# Patient Record
Sex: Female | Born: 1990 | Hispanic: No | Marital: Single | Smoking: Never smoker
Health system: Southern US, Community
[De-identification: ages and names within clinical notes are randomized; demographics above are authoritative.]

## PROBLEM LIST (undated history)

## (undated) ENCOUNTER — Inpatient Hospital Stay (HOSPITAL_COMMUNITY): Payer: Self-pay

## (undated) DIAGNOSIS — O350XX0 Maternal care for (suspected) central nervous system malformation in fetus, not applicable or unspecified: Secondary | ICD-10-CM

## (undated) DIAGNOSIS — S022XXA Fracture of nasal bones, initial encounter for closed fracture: Secondary | ICD-10-CM

## (undated) DIAGNOSIS — O139 Gestational [pregnancy-induced] hypertension without significant proteinuria, unspecified trimester: Secondary | ICD-10-CM

## (undated) DIAGNOSIS — Z3400 Encounter for supervision of normal first pregnancy, unspecified trimester: Secondary | ICD-10-CM

## (undated) DIAGNOSIS — I10 Essential (primary) hypertension: Secondary | ICD-10-CM

## (undated) HISTORY — PX: NOSE SURGERY: SHX723

---
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
## 2016-03-06 ENCOUNTER — Emergency Department (HOSPITAL_COMMUNITY)
Admission: EM | Admit: 2016-03-06 | Discharge: 2016-03-06 | Disposition: A | Discharge status: Home / Self Care | Payer: Self-pay | Attending: Emergency Medicine | Admitting: Emergency Medicine

## 2016-03-06 ENCOUNTER — Encounter (HOSPITAL_COMMUNITY): Payer: Self-pay | Admitting: Emergency Medicine

## 2016-03-06 DIAGNOSIS — R21 Rash and other nonspecific skin eruption: Secondary | ICD-10-CM | POA: Insufficient documentation

## 2016-03-06 HISTORY — DX: Fracture of nasal bones, initial encounter for closed fracture: S02.2XXA

## 2016-03-06 MED ORDER — PREDNISONE 20 MG PO TABS: 40.0000 mg | ORAL_TABLET | Freq: Every day | ORAL | 0 refills | Status: DC

## 2016-03-06 NOTE — ED Triage Notes (Signed)
Patient reports rash (first noticed l11/30/17) that originated on her right arm and has not spread to her shoulder, neck, and back regions. Reports that the rash burns. Patient has taken benadryl at home with minimal relief.

## 2016-03-06 NOTE — Discharge Instructions (Signed)
Continue taking Benadryl every 6 hours as needed for itching. Start taking prednisone daily over the next 4 days. Follow-up with dermatology. Return to the emergency department for worsening symptoms including shortness of breath, difficulty breathing, fever, or any new or concerning symptoms.

## 2016-03-06 NOTE — ED Provider Notes (Signed)
[facility] DEPT Provider Note   CSN: [id number] Arrival date & time: 03/06/16  1525  By signing my name below, I, Judithe Modest, attest that this documentation has been prepared under the direction and in the presence of Gloriann Loan, PA-C. Electronically Signed: Judithe Modest, ER Scribe. 11/12/2015. 4:00 PM.  History   Chief Complaint Chief Complaint  Patient presents with  . Rash   HPI  HPI Comments: Tiffany Ruiz is a 25 y.o. female who presents to the Emergency Department complaining of five days of itchy, burning rash all over her body. She has not changed her soap, lotion or other hygiene products. She denies eating unfamiliar foods. She denies changing medications, fever, or difficulty breathing. She has taken benadryl without relief.    Past Medical History:  Diagnosis Date  . Nose fracture     There are no active problems to display for this patient.   Past Surgical History:  Procedure Laterality Date  . NOSE SURGERY      OB History    No data available       Home Medications    Prior to Admission medications   Medication Sig Start Date End Date Taking? Authorizing Provider  predniSONE (DELTASONE) 20 MG tablet Take 2 tablets (40 mg total) by mouth daily. 03/06/16   Gloriann Loan, PA-C    Family History No family history on file.  Social History Social History  Substance Use Topics  . Smoking status: Never Smoker  . Smokeless tobacco: Never Used  . Alcohol use Yes     Allergies   Patient has no allergy information on record.   Review of Systems Review of Systems  Constitutional: Negative for chills and fever.  Respiratory: Negative for shortness of breath.   Cardiovascular: Negative for chest pain.  Skin: Positive for rash.  Neurological: Negative for weakness.  All other systems reviewed and are negative.  Physical Exam Updated Vital Signs BP 142/76 (BP Location: Right Arm)   Pulse 88   Temp 98.8 F (37.1 C) (Oral)   Resp 18    Ht 5\' 2"  (1.575 m)   Wt 68 kg   LMP 02/10/2016   SpO2 99%   BMI 27.44 kg/m   Physical Exam  Constitutional: She is oriented to person, place, and time. She appears well-developed and well-nourished. No distress.  HENT:  Head: Normocephalic and atraumatic.  Eyes: Pupils are equal, round, and reactive to light.  Neck: Neck supple.  Cardiovascular: Normal rate.   Pulmonary/Chest: Effort normal. No respiratory distress.  Musculoskeletal: Normal range of motion.  Neurological: She is alert and oriented to person, place, and time. Coordination normal.  Skin: Skin is warm and dry. Rash noted. She is not diaphoretic.  Generalized maculopapular rash over arms, torso, and legs. No purulent drainage or signs of infection.   Psychiatric: She has a normal mood and affect. Her behavior is normal.  Nursing note and vitals reviewed.    ED Treatments / Results  Labs (all labs ordered are listed, but only abnormal results are displayed) Labs Reviewed - No data to display  EKG  EKG Interpretation None       Radiology No results found.  Procedures Procedures (including critical care time)  Medications Ordered in ED Medications - No data to display   Initial Impression / Assessment and Plan / ED Course  I have reviewed the triage vital signs and the nursing notes.  Pertinent labs & imaging results that were available during my care of  the patient were reviewed by me and considered in my medical decision making (see chart for details).  Clinical Course     Patient with nonspecific eruption. No signs of infection. No signs of anaphylaxis.  Discharge with symptomatic treatment. Follow up with Dermatology.  Return precautions discussed.    Final Clinical Impressions(s) / ED Diagnoses   Final diagnoses:  Rash    New Prescriptions Discharge Medication List as of 03/06/2016  4:05 PM    START taking these medications   Details  predniSONE (DELTASONE) 20 MG tablet Take 2  tablets (40 mg total) by mouth daily., Starting Tue 03/06/2016, Print        I personally performed the services described in this documentation, which was scribed in my presence. The recorded information has been reviewed and is accurate.        Gloriann Loan, PA-C 03/06/16 1629    Davonna Belling, MD 03/06/16 [phone]

## 2017-04-02 NOTE — L&D Delivery Note (Addendum)
Patient: Tiffany Ruiz MRN: [id number]  GBS status: postive, IAP given penicillin given x2  Patient is a 27 y.o. now G1P1 s/p NSVD at [redacted]w[redacted]d, who was admitted for SOL. SROM 1h 55m prior to delivery with clear  fluid.    Delivery Note At 5:22 PM a viable and healthy female was delivered via Vaginal, Spontaneous (Presentation:vertex; LOA ).  APGAR: 9, 9; weight  pending.   Placenta status: delivered spontaneous, intact.  Cord: 3-vessel with the following complications: none.  Cord pH: pending  Anesthesia:  Fentanyl with plans for epidural but waited too long Episiotomy: None Lacerations: 2nd degree Suture Repair: 3.0 vicryl Est. Blood Loss (mL): 150  Mom to postpartum.  Baby to Couplet care / Skin to Skin.  Chelsey Geanie Berlin 03/13/2018, 6:29 PM  Head delivered LOA. No nuchal cord present. Shoulder and body delivered in usual fashion. Infant with spontaneous cry, placed on mother's abdomen, dried and bulb suctioned. Cord clamped x 2 after 1-minute delay, and cut by family member. Cord blood drawn. Placenta delivered spontaneously with gentle cord traction. Fundus firm with massage and Pitocin. Perineum inspected and found to have 2nd degree laceration,which was repaired with 3 sutures with good hemostasis achieved.  Midwife attestation: I was gloved and present for delivery in its entirety and I agree with the above resident's note.  Maryann Conners, CNM 6:52 PM

## 2017-09-05 ENCOUNTER — Other Ambulatory Visit: Payer: Self-pay

## 2017-09-05 ENCOUNTER — Emergency Department (HOSPITAL_COMMUNITY)
Admission: EM | Admit: 2017-09-05 | Discharge: 2017-09-06 | Disposition: A | Discharge status: Home / Self Care | Payer: Medicaid Other | Attending: Emergency Medicine | Admitting: Emergency Medicine

## 2017-09-05 ENCOUNTER — Encounter (HOSPITAL_COMMUNITY): Payer: Self-pay | Admitting: Emergency Medicine

## 2017-09-05 DIAGNOSIS — O2311 Infections of bladder in pregnancy, first trimester: Secondary | ICD-10-CM | POA: Diagnosis not present

## 2017-09-05 DIAGNOSIS — O2341 Unspecified infection of urinary tract in pregnancy, first trimester: Secondary | ICD-10-CM

## 2017-09-05 DIAGNOSIS — Z3A10 10 weeks gestation of pregnancy: Secondary | ICD-10-CM | POA: Diagnosis not present

## 2017-09-05 DIAGNOSIS — R1012 Left upper quadrant pain: Secondary | ICD-10-CM | POA: Diagnosis present

## 2017-09-05 NOTE — ED Triage Notes (Signed)
Pt from home with c/o left sided abdominal pain. Pt reports she is [redacted] weeks pregnant. Pt states she is also constipated and has a headache. Pt denies any abnormal discharge and denies bleeding.

## 2017-09-06 LAB — URINALYSIS, ROUTINE W REFLEX MICROSCOPIC
Bilirubin Urine: NEGATIVE
GLUCOSE, UA: NEGATIVE mg/dL
HGB URINE DIPSTICK: NEGATIVE
KETONES UR: 20 mg/dL — AB
NITRITE: NEGATIVE
PH: 6 (ref 5.0–8.0)
PROTEIN: NEGATIVE mg/dL
Specific Gravity, Urine: 1.005 (ref 1.005–1.030)

## 2017-09-06 LAB — PREGNANCY, URINE: Preg Test, Ur: POSITIVE — AB

## 2017-09-06 MED ORDER — CEPHALEXIN 500 MG PO CAPS
1000.0000 mg | ORAL_CAPSULE | Freq: Once | ORAL | Status: AC
  Administered 2017-09-06: 1000 mg via ORAL
  Filled 2017-09-06: qty 2

## 2017-09-06 MED ORDER — ACETAMINOPHEN 500 MG PO TABS
1000.0000 mg | ORAL_TABLET | Freq: Once | ORAL | Status: AC
  Administered 2017-09-06: 1000 mg via ORAL
  Filled 2017-09-06: qty 2

## 2017-09-06 MED ORDER — SUCRALFATE 1 G PO TABS: 1.0000 g | ORAL_TABLET | Freq: Four times a day (QID) | ORAL | 0 refills | Status: DC | PRN

## 2017-09-06 MED ORDER — CEPHALEXIN 500 MG PO CAPS: 500.0000 mg | ORAL_CAPSULE | Freq: Two times a day (BID) | ORAL | 0 refills | Status: DC

## 2017-09-06 MED ORDER — SUCRALFATE 1 G PO TABS
1.0000 g | ORAL_TABLET | Freq: Once | ORAL | Status: AC
  Administered 2017-09-06: 1 g via ORAL
  Filled 2017-09-06: qty 1

## 2017-09-06 NOTE — ED Notes (Addendum)
Pt aware that urine sample is needed.  

## 2017-09-06 NOTE — ED Provider Notes (Signed)
[facility] DEPT Provider Note: Georgena Spurling, MD, FACEP  CSN: [id number] MRN: [id number] ARRIVAL: 09/05/17 at 2124 ROOM: [place]  Abdominal Pain   HISTORY OF PRESENT ILLNESS  09/06/17 12:26 AM Tiffany Ruiz is a 27 y.o. female who reports being [redacted] weeks pregnant.  She has had a headache since yesterday.  The headache is not severe.  She took one Tylenol yesterday without relief.  She has also had some nausea but relates this to usual pregnancy related nausea.  She is here now with a 1 day history of left upper quadrant abdominal pain.  The pain is dull and moderate in severity, worse with palpation. There is associated constipation.  She denies vaginal discharge or bleeding.   Past Medical History:  Diagnosis Date  . Nose fracture     Past Surgical History:  Procedure Laterality Date  . NOSE SURGERY      No family history on file.  Social History   Tobacco Use  . Smoking status: Never Smoker  . Smokeless tobacco: Never Used  Substance Use Topics  . Alcohol use: Yes  . Drug use: Not on file    Prior to Admission medications   Medication Sig Start Date End Date Taking? Authorizing Provider  predniSONE (DELTASONE) 20 MG tablet Take 2 tablets (40 mg total) by mouth daily. Patient not taking: Reported on 09/06/2017 03/06/16   Gloriann Loan, PA-C    Allergies Patient has no known allergies.   REVIEW OF SYSTEMS  Negative except as noted here or in the History of Present Illness.   PHYSICAL EXAMINATION  Initial Vital Signs Blood pressure 120/75, pulse 72, temperature 98.2 F (36.8 C), temperature source Oral, resp. rate 16, SpO2 100 %.  Examination General: Well-developed, well-nourished female in no acute distress; appearance consistent with age of record HENT: normocephalic; atraumatic Eyes: pupils equal, round and reactive to light; extraocular muscles intact Neck: supple Heart: regular rate and rhythm Lungs: clear to auscultation  bilaterally Abdomen: soft; nondistended; left upper quadrant tenderness; no masses or hepatosplenomegaly; bowel sounds present Extremities: No deformity; full range of motion; pulses normal Neurologic: Awake, alert and oriented; motor function intact in all extremities and symmetric; no facial droop Skin: Warm and dry Psychiatric: Normal mood and affect   RESULTS  Summary of this visit's results, reviewed by myself:   EKG Interpretation  Date/Time:    Ventricular Rate:    PR Interval:    QRS Duration:   QT Interval:    QTC Calculation:   R Axis:     Text Interpretation:        Laboratory Studies: Results for orders placed or performed during the hospital encounter of 09/05/17 (from the past 24 hour(s))  Urinalysis, Routine w reflex microscopic     Status: Abnormal   Collection Time: 09/06/17  1:42 AM  Result Value Ref Range   Color, Urine STRAW (A) YELLOW   APPearance HAZY (A) CLEAR   Specific Gravity, Urine 1.005 1.005 - 1.030   pH 6.0 5.0 - 8.0   Glucose, UA NEGATIVE NEGATIVE mg/dL   Hgb urine dipstick NEGATIVE NEGATIVE   Bilirubin Urine NEGATIVE NEGATIVE   Ketones, ur 20 (A) NEGATIVE mg/dL   Protein, ur NEGATIVE NEGATIVE mg/dL   Nitrite NEGATIVE NEGATIVE   Leukocytes, UA LARGE (A) NEGATIVE   RBC / HPF 6-10 0 - 5 RBC/hpf   WBC, UA 11-20 0 - 5 WBC/hpf   Bacteria, UA RARE (A) NONE SEEN   Squamous Epithelial /  LPF 11-20 0 - 5   Mucus PRESENT   Pregnancy, urine     Status: Abnormal   Collection Time: 09/06/17  1:42 AM  Result Value Ref Range   Preg Test, Ur POSITIVE (A) NEGATIVE   Imaging Studies: No results found.  ED COURSE and MDM  Nursing notes and initial vitals signs, including pulse oximetry, reviewed.  Vitals:   09/05/17 2204 09/06/17 0034 09/06/17 0100 09/06/17 0130  BP: 120/75 129/82 111/75 118/74  Pulse: 72 87 94 89  Resp: 16 17 20  (!) 21  Temp: 98.2 F (36.8 C)     TempSrc: Oral     SpO2: 100% 100% 100% 99%   2:42 AM Patient feels better  after Carafate.  I suspect her pain is due to gastritis.  Her urinalysis is consistent with a urinary tract infection and given that she is pregnant we will treat.  PROCEDURES    ED DIAGNOSES     ICD-10-CM   1. Left upper quadrant pain R10.12   2. Urinary tract infection in mother during first trimester of pregnancy O23.41        Mikaili Flippin, MD 09/06/17 [phone]

## 2017-09-07 ENCOUNTER — Telehealth (HOSPITAL_BASED_OUTPATIENT_CLINIC_OR_DEPARTMENT_OTHER): Payer: Self-pay | Admitting: Emergency Medicine

## 2017-09-07 LAB — URINE CULTURE

## 2017-09-08 ENCOUNTER — Telehealth: Payer: Self-pay

## 2017-09-08 NOTE — Telephone Encounter (Signed)
Post ED Visit - Positive Culture Follow-up  Culture report reviewed by antimicrobial stewardship pharmacist:  []  Elenor Quinones, Pharm.D. []  Heide Guile, Pharm.D., BCPS AQ-ID []  Parks Neptune, Pharm.D., BCPS []  Alycia Rossetti, Pharm.D., BCPS []  [place]lea, Pharm.D., BCPS, AAHIVP []  Legrand Como, Pharm.D., BCPS, AAHIVP []  Salome Arnt, PharmD, BCPS []  Wynell Balloon, PharmD []  Vincenza Hews, PharmD, BCPS [facility] Pharm D  Positive urine culture  and no further patient follow-up is required at this time.  Genia Del 09/08/2017, 10:23 AM

## 2017-09-19 ENCOUNTER — Ambulatory Visit (INDEPENDENT_AMBULATORY_CARE_PROVIDER_SITE_OTHER): Payer: Medicaid Other | Admitting: Certified Nurse Midwife

## 2017-09-19 ENCOUNTER — Other Ambulatory Visit (HOSPITAL_COMMUNITY)
Admission: RE | Admit: 2017-09-19 | Discharge: 2017-09-19 | Disposition: A | Discharge status: Home / Self Care | Payer: Medicaid Other | Source: Ambulatory Visit | Attending: Certified Nurse Midwife | Admitting: Certified Nurse Midwife

## 2017-09-19 ENCOUNTER — Encounter: Payer: Self-pay | Admitting: Certified Nurse Midwife

## 2017-09-19 VITALS — BP 127/81 | HR 96 | Wt 161.0 lb

## 2017-09-19 DIAGNOSIS — Z3400 Encounter for supervision of normal first pregnancy, unspecified trimester: Secondary | ICD-10-CM | POA: Insufficient documentation

## 2017-09-19 DIAGNOSIS — Z3401 Encounter for supervision of normal first pregnancy, first trimester: Secondary | ICD-10-CM

## 2017-09-19 DIAGNOSIS — Z3A00 Weeks of gestation of pregnancy not specified: Secondary | ICD-10-CM | POA: Insufficient documentation

## 2017-09-19 HISTORY — DX: Encounter for supervision of normal first pregnancy, unspecified trimester: Z34.00

## 2017-09-19 MED ORDER — OB COMPLETE PETITE 35-5-1-200 MG PO CAPS
1.0000 | ORAL_CAPSULE | Freq: Every day | ORAL | 12 refills | Status: DC
Start: 2017-09-19 — End: 2018-02-24

## 2017-09-19 NOTE — Patient Instructions (Signed)
First Trimester of Pregnancy The first trimester of pregnancy is from week 1 until the end of week 13 (months 1 through 3). A week after a sperm fertilizes an egg, the egg will implant on the wall of the uterus. This embryo will begin to develop into a baby. Genes from you and your partner will form the baby. The female genes will determine whether the baby will be a boy or a girl. At 6-8 weeks, the eyes and face will be formed, and the heartbeat can be seen on ultrasound. At the end of 12 weeks, all the baby's organs will be formed. Now that you are pregnant, you will want to do everything you can to have a healthy baby. Two of the most important things are to get good prenatal care and to follow your health care provider's instructions. Prenatal care is all the medical care you receive before the baby's birth. This care will help prevent, find, and treat any problems during the pregnancy and childbirth. Body changes during your first trimester Your body goes through many changes during pregnancy. The changes vary from woman to woman.  You may gain or lose a couple of pounds at first.  You may feel sick to your stomach (nauseous) and you may throw up (vomit). If the vomiting is uncontrollable, call your health care provider.  You may tire easily.  You may develop headaches that can be relieved by medicines. All medicines should be approved by your health care provider.  You may urinate more often. Painful urination may mean you have a bladder infection.  You may develop heartburn as a result of your pregnancy.  You may develop constipation because certain hormones are causing the muscles that push stool through your intestines to slow down.  You may develop hemorrhoids or swollen veins (varicose veins).  Your breasts may begin to grow larger and become tender. Your nipples may stick out more, and the tissue that surrounds them (areola) may become darker.  Your gums may bleed and may be  sensitive to brushing and flossing.  Dark spots or blotches (chloasma, mask of pregnancy) may develop on your face. This will likely fade after the baby is born.  Your menstrual periods will stop.  You may have a loss of appetite.  You may develop cravings for certain kinds of food.  You may have changes in your emotions from day to day, such as being excited to be pregnant or being concerned that something may go wrong with the pregnancy and baby.  You may have more vivid and strange dreams.  You may have changes in your hair. These can include thickening of your hair, rapid growth, and changes in texture. Some women also have hair loss during or after pregnancy, or hair that feels dry or thin. Your hair will most likely return to normal after your baby is born.  What to expect at prenatal visits During a routine prenatal visit:  You will be weighed to make sure you and the baby are growing normally.  Your blood pressure will be taken.  Your abdomen will be measured to track your baby's growth.  The fetal heartbeat will be listened to between weeks 10 and 14 of your pregnancy.  Test results from any previous visits will be discussed.  Your health care provider may ask you:  How you are feeling.  If you are feeling the baby move.  If you have had any abnormal symptoms, such as leaking fluid, bleeding, severe headaches,   or abdominal cramping.  If you are using any tobacco products, including cigarettes, chewing tobacco, and electronic cigarettes.  If you have any questions.  Other tests that may be performed during your first trimester include:  Blood tests to find your blood type and to check for the presence of any previous infections. The tests will also be used to check for low iron levels (anemia) and protein on red blood cells (Rh antibodies). Depending on your risk factors, or if you previously had diabetes during pregnancy, you may have tests to check for high blood  sugar that affects pregnant women (gestational diabetes).  Urine tests to check for infections, diabetes, or protein in the urine.  An ultrasound to confirm the proper growth and development of the baby.  Fetal screens for spinal cord problems (spina bifida) and Down syndrome.  HIV (human immunodeficiency virus) testing. Routine prenatal testing includes screening for HIV, unless you choose not to have this test.  You may need other tests to make sure you and the baby are doing well.  Follow these instructions at home: Medicines  Follow your health care provider's instructions regarding medicine use. Specific medicines may be either safe or unsafe to take during pregnancy.  Take a prenatal vitamin that contains at least 600 micrograms (mcg) of folic acid.  If you develop constipation, try taking a stool softener if your health care provider approves. Eating and drinking  Eat a balanced diet that includes fresh fruits and vegetables, whole grains, good sources of protein such as meat, eggs, or tofu, and low-fat dairy. Your health care provider will help you determine the amount of weight gain that is right for you.  Avoid raw meat and uncooked cheese. These carry germs that can cause birth defects in the baby.  Eating four or five small meals rather than three large meals a day may help relieve nausea and vomiting. If you start to feel nauseous, eating a few soda crackers can be helpful. Drinking liquids between meals, instead of during meals, also seems to help ease nausea and vomiting.  Limit foods that are high in fat and processed sugars, such as fried and sweet foods.  To prevent constipation: ? Eat foods that are high in fiber, such as fresh fruits and vegetables, whole grains, and beans. ? Drink enough fluid to keep your urine clear or pale yellow. Activity  Exercise only as directed by your health care provider. Most women can continue their usual exercise routine during  pregnancy. Try to exercise for 30 minutes at least 5 days a week. Exercising will help you: ? Control your weight. ? Stay in shape. ? Be prepared for labor and delivery.  Experiencing pain or cramping in the lower abdomen or lower back is a good sign that you should stop exercising. Check with your health care provider before continuing with normal exercises.  Try to avoid standing for long periods of time. Move your legs often if you must stand in one place for a long time.  Avoid heavy lifting.  Wear low-heeled shoes and practice good posture.  You may continue to have sex unless your health care provider tells you not to. Relieving pain and discomfort  Wear a good support bra to relieve breast tenderness.  Take warm sitz baths to soothe any pain or discomfort caused by hemorrhoids. Use hemorrhoid cream if your health care provider approves.  Rest with your legs elevated if you have leg cramps or low back pain.  If you develop   varicose veins in your legs, wear support hose. Elevate your feet for 15 minutes, 3-4 times a day. Limit salt in your diet. Prenatal care  Schedule your prenatal visits by the twelfth week of pregnancy. They are usually scheduled monthly at first, then more often in the last 2 months before delivery.  Write down your questions. Take them to your prenatal visits.  Keep all your prenatal visits as told by your health care provider. This is important. Safety  Wear your seat belt at all times when driving.  Make a list of emergency phone numbers, including numbers for family, friends, the hospital, and police and fire departments. General instructions  Ask your health care provider for a referral to a local prenatal education class. Begin classes no later than the beginning of month 6 of your pregnancy.  Ask for help if you have counseling or nutritional needs during pregnancy. Your health care provider can offer advice or refer you to specialists for help  with various needs.  Do not use hot tubs, steam rooms, or saunas.  Do not douche or use tampons or scented sanitary pads.  Do not cross your legs for long periods of time.  Avoid cat litter boxes and soil used by cats. These carry germs that can cause birth defects in the baby and possibly loss of the fetus by miscarriage or stillbirth.  Avoid all smoking, herbs, alcohol, and medicines not prescribed by your health care provider. Chemicals in these products affect the formation and growth of the baby.  Do not use any products that contain nicotine or tobacco, such as cigarettes and e-cigarettes. If you need help quitting, ask your health care provider. You may receive counseling support and other resources to help you quit.  Schedule a dentist appointment. At home, brush your teeth with a soft toothbrush and be gentle when you floss. Contact a health care provider if:  You have dizziness.  You have mild pelvic cramps, pelvic pressure, or nagging pain in the abdominal area.  You have persistent nausea, vomiting, or diarrhea.  You have a bad smelling vaginal discharge.  You have pain when you urinate.  You notice increased swelling in your face, hands, legs, or ankles.  You are exposed to fifth disease or chickenpox.  You are exposed to German measles (rubella) and have never had it. Get help right away if:  You have a fever.  You are leaking fluid from your vagina.  You have spotting or bleeding from your vagina.  You have severe abdominal cramping or pain.  You have rapid weight gain or loss.  You vomit blood or material that looks like coffee grounds.  You develop a severe headache.  You have shortness of breath.  You have any kind of trauma, such as from a fall or a car accident. Summary  The first trimester of pregnancy is from week 1 until the end of week 13 (months 1 through 3).  Your body goes through many changes during pregnancy. The changes vary from  woman to woman.  You will have routine prenatal visits. During those visits, your health care provider will examine you, discuss any test results you may have, and talk with you about how you are feeling. This information is not intended to replace advice given to you by your health care provider. Make sure you discuss any questions you have with your health care provider. Document Released: 03/13/2001 Document Revised: 02/29/2016 Document Reviewed: 02/29/2016 Elsevier Interactive Patient Education  2018 Else[employer]   Inc[employer]  Constipation, Adult Constipation is when a person has fewer bowel movements in a week than normal, has difficulty having a bowel movement, or has stools that are dry, hard, or larger than normal. Constipation may be caused by an underlying condition. It may become worse with age if a person takes certain medicines and does not take in enough fluids. Follow these instructions at home: Eating and drinking   Eat foods that have a lot of fiber, such as fresh fruits and vegetables, whole grains, and beans.  Limit foods that are high in fat, low in fiber, or overly processed, such as french fries, hamburgers, cookies, candies, and soda.  Drink enough fluid to keep your urine clear or pale yellow. General instructions  Exercise regularly or as told by your health care provider.  Go to the restroom when you have the urge to go. Do not hold it in.  Take over-the-counter and prescription medicines only as told by your health care provider. These include any fiber supplements.  Practice pelvic floor retraining exercises, such as deep breathing while relaxing the lower abdomen and pelvic floor relaxation during bowel movements.  Watch your condition for any changes.  Keep all follow-up visits as told by your health care provider. This is important. Contact a health care provider if:  You have pain that gets worse.  You have a fever.  You do not have a bowel movement after 4  days.  You vomit.  You are not hungry.  You lose weight.  You are bleeding from the anus.  You have thin, pencil-like stools. Get help right away if:  You have a fever and your symptoms suddenly get worse.  You leak stool or have blood in your stool.  Your abdomen is bloated.  You have severe pain in your abdomen.  You feel dizzy or you faint. This information is not intended to replace advice given to you by your health care provider. Make sure you discuss any questions you have with your health care provider. Document Released: 12/16/2003 Document Revised: 10/07/2015 Document Reviewed: 09/07/2015 Elsevier Interactive Patient Education  2018 [employer].  Prenatal Care WHAT IS PRENATAL CARE? Prenatal care is the process of caring for a pregnant woman before she gives birth. Prenatal care makes sure that she and her baby remain as healthy as possible throughout pregnancy. Prenatal care may be provided by a midwife, family practice health care provider, or a childbirth and pregnancy specialist (obstetrician). Prenatal care may include physical examinations, testing, treatments, and education on nutrition, lifestyle, and social support services. WHY IS PRENATAL CARE SO IMPORTANT? Early and consistent prenatal care increases the chance that you and your baby will remain healthy throughout your pregnancy. This type of care also decreases a baby's risk of being born too early (prematurely), or being born smaller than expected (small for gestational age). Any underlying medical conditions you may have that could pose a risk during your pregnancy are discussed during prenatal care visits. You will also be monitored regularly for any new conditions that may arise during your pregnancy so they can be treated quickly and effectively. WHAT HAPPENS DURING PRENATAL CARE VISITS? Prenatal care visits may include the following: Discussion Tell your health care provider about any new signs or  symptoms you have experienced since your last visit. These might include:  Nausea or vomiting.  Increased or decreased level of energy.  Difficulty sleeping.  Back or leg pain.  Weight changes.  Frequent urination.  Shortness of breath  with physical activity.  Changes in your skin, such as the development of a rash or itchiness.  Vaginal discharge or bleeding.  Feelings of excitement or nervousness.  Changes in your baby's movements.  You may want to write down any questions or topics you want to discuss with your health care provider and bring them with you to your appointment. Examination During your first prenatal care visit, you will likely have a complete physical exam. Your health care provider will often examine your vagina, cervix, and the position of your uterus, as well as check your heart, lungs, and other body systems. As your pregnancy progresses, your health care provider will measure the size of your uterus and your baby's position inside your uterus. He or she may also examine you for early signs of labor. Your prenatal visits may also include checking your blood pressure and, after about 10-12 weeks of pregnancy, listening to your baby's heartbeat. Testing Regular testing often includes:  Urinalysis. This checks your urine for glucose, protein, or signs of infection.  Blood count. This checks the levels of white and red blood cells in your body.  Tests for sexually transmitted infections (STIs). Testing for STIs at the beginning of pregnancy is routinely done and is required in many states.  Antibody testing. You will be checked to see if you are immune to certain illnesses, such as rubella, that can affect a developing fetus.  Glucose screen. Around 24-28 weeks of pregnancy, your blood glucose level will be checked for signs of gestational diabetes. Follow-up tests may be recommended.  Group B strep. This is a bacteria that is commonly found inside a woman's  vagina. This test will inform your health care provider if you need an antibiotic to reduce the amount of this bacteria in your body prior to labor and childbirth.  Ultrasound. Many pregnant women undergo an ultrasound screening around 18-20 weeks of pregnancy to evaluate the health of the fetus and check for any developmental abnormalities.  HIV (human immunodeficiency virus) testing. Early in your pregnancy, you will be screened for HIV. If you are at high risk for HIV, this test may be repeated during your third trimester of pregnancy.  You may be offered other testing based on your age, personal or family medical history, or other factors. HOW OFTEN SHOULD I PLAN TO SEE MY HEALTH CARE PROVIDER FOR PRENATAL CARE? Your prenatal care check-up schedule depends on any medical conditions you have before, or develop during, your pregnancy. If you do not have any underlying medical conditions, you will likely be seen for checkups:  Monthly, during the first 6 months of pregnancy.  Twice a month during months 7 and 8 of pregnancy.  Weekly starting in the 9th month of pregnancy and until delivery.  If you develop signs of early labor or other concerning signs or symptoms, you may need to see your health care provider more often. Ask your health care provider what prenatal care schedule is best for you. WHAT CAN I DO TO KEEP MYSELF AND MY BABY AS HEALTHY AS POSSIBLE DURING MY PREGNANCY?  Take a prenatal vitamin containing 400 micrograms (0.4 mg) of folic acid every day. Your health care provider may also ask you to take additional vitamins such as iodine, vitamin D, iron, copper, and zinc.  Take 1500-2000 mg of calcium daily starting at your 20th week of pregnancy until you deliver your baby.  Make sure you are up to date on your vaccinations. Unless directed otherwise by your  health care provider: ? You should receive a tetanus, diphtheria, and pertussis (Tdap) vaccination between the 27th and 36th  week of your pregnancy, regardless of when your last Tdap immunization occurred. This helps protect your baby from whooping cough (pertussis) after he or she is born. ? You should receive an annual inactivated influenza vaccine (IIV) to help protect you and your baby from influenza. This can be done at any point during your pregnancy.  Eat a well-rounded diet that includes: ? Fresh fruits and vegetables. ? Lean proteins. ? Calcium-rich foods such as milk, yogurt, hard cheeses, and dark, leafy greens. ? Whole grain breads.  Do noteat seafood high in mercury, including: ? Swordfish. ? Tilefish. ? Shark. ? King mackerel. ? More than 6 oz tuna per week.  Do not eat: ? Raw or undercooked meats or eggs. ? Unpasteurized foods, such as soft cheeses (brie, blue, or feta), juices, and milks. ? Lunch meats. ? Hot dogs that have not been heated until they are steaming.  Drink enough water to keep your urine clear or pale yellow. For many women, this may be 10 or more 8 oz glasses of water each day. Keeping yourself hydrated helps deliver nutrients to your baby and may prevent the start of pre-term uterine contractions.  Do not use any tobacco products including cigarettes, chewing tobacco, or electronic cigarettes. If you need help quitting, ask your health care provider.  Do not drink beverages containing alcohol. No safe level of alcohol consumption during pregnancy has been determined.  Do not use any illegal drugs. These can harm your developing baby or cause a miscarriage.  Ask your health care provider or pharmacist before taking any prescription or over-the-counter medicines, herbs, or supplements.  Limit your caffeine intake to no more than 200 mg per day.  Exercise. Unless told otherwise by your health care provider, try to get 30 minutes of moderate exercise most days of the week. Do not  do high-impact activities, contact sports, or activities with a high risk of falling, such as  horseback riding or downhill skiing.  Get plenty of rest.  Avoid anything that raises your body temperature, such as hot tubs and saunas.  If you own a cat, do not empty its litter box. Bacteria contained in cat feces can cause an infection called toxoplasmosis. This can result in serious harm to the fetus.  Stay away from chemicals such as insecticides, lead, mercury, and cleaning or paint products that contain solvents.  Do not have any X-rays taken unless medically necessary.  Take a childbirth and breastfeeding preparation class. Ask your health care provider if you need a referral or recommendation.  This information is not intended to replace advice given to you by your health care provider. Make sure you discuss any questions you have with your health care provider. Document Released: 03/22/2003 Document Revised: 08/22/2015 Document Reviewed: 06/03/2013 Elsevier Interactive Patient Education  2017 [employer].

## 2017-09-19 NOTE — Progress Notes (Signed)
Subjective:   Tiffany Ruiz is a 27 y.o. G1P0 at [redacted]w[redacted]d by LMP being seen today for her first obstetrical visit.  Her obstetrical history is significant for none. Patient does intend to breast feed. Pregnancy history fully reviewed.  Patient reports no bleeding, no contractions, no cramping, no leaking and constipation: OTC Colace discussed.  HISTORY: OB History  Gravida Para Term Preterm AB Living  1 0 0 0 0 0  SAB TAB Ectopic Multiple Live Births  0 0 0 0 0    # Outcome Date GA Lbr Len/2nd Weight Sex Delivery Anes PTL Lv  1 Current             [facility]. Normal according to the patient  Past Medical History:  Diagnosis Date  . Nose fracture    Past Surgical History:  Procedure Laterality Date  . MOUTH SURGERY  2011   wisdom tooth   . NOSE SURGERY     Family History  Problem Relation Age of Onset  . Stroke Maternal Grandfather    Social History   Tobacco Use  . Smoking status: Never Smoker  . Smokeless tobacco: Never Used  Substance Use Topics  . Alcohol use: Not Currently  . Drug use: Never   No Known Allergies Current Outpatient Medications on File Prior to Visit  Medication Sig Dispense Refill  . sucralfate (CARAFATE) 1 g tablet Take 1 tablet (1 g total) by mouth 4 (four) times daily as needed (stomach pain). (Patient not taking: Reported on 09/19/2017) 30 tablet 0   No current facility-administered medications on file prior to visit.     Review of Systems Pertinent items noted in HPI and remainder of comprehensive ROS otherwise negative.  Exam   Vitals:   09/19/17 1443  BP: 127/81  Pulse: 96  Weight: 161 lb (73 kg)   Fetal Heart Rate (bpm): 150; doppler  Uterus:     Pelvic Exam: Perineum: no hemorrhoids, normal perineum   Vulva: normal external genitalia, no lesions   Vagina:  normal mucosa, normal discharge   Cervix: no lesions and normal, pap smear done.    Adnexa: normal adnexa and  no mass, fullness, tenderness   Bony Pelvis: average  System: General: well-developed, well-nourished female in no acute distress   Breast:  normal appearance, no masses or tenderness   Skin: normal coloration and turgor, no rashes   Neurologic: oriented, normal, negative, normal mood   Extremities: normal strength, tone, and muscle mass, ROM of all joints is normal   HEENT PERRLA, extraocular movement intact and sclera clear, anicteric   Mouth/Teeth mucous membranes moist, pharynx normal without lesions and dental hygiene good   Neck supple and no masses   Cardiovascular: regular rate and rhythm   Respiratory:  no respiratory distress, normal breath sounds   Abdomen: soft, non-tender; bowel sounds normal; no masses,  no organomegaly     Assessment:   Pregnancy: G1P0 Patient Active Problem List   Diagnosis Date Noted  . Supervision of normal first pregnancy, antepartum 09/19/2017     Plan:  1. Supervision of normal first pregnancy, antepartum      - Hemoglobinopathy evaluation - Hemoglobin A1c - Obstetric Panel, Including HIV - Inheritest Core(CF97,SMA,FraX) - Genetic Screening - Enroll Patient in Babyscripts - Culture, OB Urine - Vitamin D (25 hydroxy) - Cytology - PAP - Cervicovaginal ancillary only - Prenat-FeCbn-FeAspGl-FA-Omega (OB COMPLETE PETITE) 35-5-1-200 MG CAPS; Take 1 tablet by  mouth daily.  Dispense: 30 capsule; Refill: 12   Initial labs drawn. Continue prenatal vitamins. Genetic Screening discussed, NIPS: ordered. Ultrasound discussed; fetal anatomic survey: ordered. Problem list reviewed and updated. The nature of [facility] with multiple MDs and other Advanced Practice Providers was explained to patient; also emphasized that residents, students are part of our team. Routine obstetric precautions reviewed. Return in about 1 month (around 10/17/2017) for ROB, [facility].     Kandis Cocking, [facility], [place]

## 2017-09-20 LAB — CERVICOVAGINAL ANCILLARY ONLY
Chlamydia: NEGATIVE
NEISSERIA GONORRHEA: NEGATIVE

## 2017-09-23 LAB — OBSTETRIC PANEL, INCLUDING HIV
Antibody Screen: NEGATIVE
BASOS ABS: 0 10*3/uL (ref 0.0–0.2)
Basos: 0 %
EOS (ABSOLUTE): 0 10*3/uL (ref 0.0–0.4)
Eos: 1 %
HEP B S AG: NEGATIVE
HIV Screen 4th Generation wRfx: NONREACTIVE
Hematocrit: 35.3 % (ref 34.0–46.6)
Hemoglobin: 11.8 g/dL (ref 11.1–15.9)
IMMATURE GRANULOCYTES: 0 %
Immature Grans (Abs): 0 10*3/uL (ref 0.0–0.1)
LYMPHS ABS: 1.3 10*3/uL (ref 0.7–3.1)
Lymphs: 17 %
MCH: 27.3 pg (ref 26.6–33.0)
MCHC: 33.4 g/dL (ref 31.5–35.7)
MCV: 82 fL (ref 79–97)
Monocytes Absolute: 0.5 10*3/uL (ref 0.1–0.9)
Monocytes: 7 %
NEUTROS PCT: 75 %
Neutrophils Absolute: 5.5 10*3/uL (ref 1.4–7.0)
PLATELETS: 207 10*3/uL (ref 150–450)
RBC: 4.32 x10E6/uL (ref 3.77–5.28)
RDW: 14.8 % (ref 12.3–15.4)
RPR: NONREACTIVE
Rh Factor: POSITIVE
Rubella Antibodies, IGG: 1.17 index (ref >0.99)
WBC: 7.4 10*3/uL (ref 3.4–10.8)

## 2017-09-23 LAB — URINE CULTURE, OB REFLEX

## 2017-09-23 LAB — HEMOGLOBIN A1C
ESTIMATED AVERAGE GLUCOSE: 108 mg/dL
HEMOGLOBIN A1C: 5.4 % (ref 4.8–5.6)

## 2017-09-23 LAB — HEMOGLOBINOPATHY EVALUATION
HGB C: 0 %
HGB S: 0 %
HGB VARIANT: 0 %
Hemoglobin A2 Quantitation: 2.2 % (ref 1.8–3.2)
Hemoglobin F Quantitation: 0 % (ref 0.0–2.0)
Hgb A: 97.8 % (ref 96.4–98.8)

## 2017-09-23 LAB — CULTURE, OB URINE

## 2017-09-23 LAB — VITAMIN D 25 HYDROXY (VIT D DEFICIENCY, FRACTURES): VIT D 25 HYDROXY: 38.2 ng/mL (ref 30.0–100.0)

## 2017-09-24 ENCOUNTER — Other Ambulatory Visit: Payer: Self-pay | Admitting: Certified Nurse Midwife

## 2017-09-24 DIAGNOSIS — B951 Streptococcus, group B, as the cause of diseases classified elsewhere: Secondary | ICD-10-CM

## 2017-09-24 DIAGNOSIS — Z3400 Encounter for supervision of normal first pregnancy, unspecified trimester: Secondary | ICD-10-CM

## 2017-09-24 MED ORDER — NITROFURANTOIN MONOHYD MACRO 100 MG PO CAPS: 100.0000 mg | ORAL_CAPSULE | Freq: Two times a day (BID) | ORAL | 0 refills | Status: DC

## 2017-09-25 ENCOUNTER — Other Ambulatory Visit: Payer: Self-pay | Admitting: Certified Nurse Midwife

## 2017-09-25 ENCOUNTER — Encounter: Payer: Self-pay | Admitting: Certified Nurse Midwife

## 2017-09-25 DIAGNOSIS — Z3400 Encounter for supervision of normal first pregnancy, unspecified trimester: Secondary | ICD-10-CM

## 2017-09-25 LAB — CYTOLOGY - PAP: DIAGNOSIS: NEGATIVE

## 2017-09-30 LAB — INHERITEST CORE(CF97,SMA,FRAX)

## 2017-10-08 ENCOUNTER — Other Ambulatory Visit: Payer: Self-pay | Admitting: Certified Nurse Midwife

## 2017-10-08 DIAGNOSIS — Z3400 Encounter for supervision of normal first pregnancy, unspecified trimester: Secondary | ICD-10-CM

## 2017-10-17 ENCOUNTER — Ambulatory Visit (INDEPENDENT_AMBULATORY_CARE_PROVIDER_SITE_OTHER): Payer: Medicaid Other | Admitting: Obstetrics and Gynecology

## 2017-10-17 ENCOUNTER — Encounter: Payer: Self-pay | Admitting: Obstetrics and Gynecology

## 2017-10-17 VITALS — BP 111/75 | HR 77 | Wt 166.0 lb

## 2017-10-17 DIAGNOSIS — R51 Headache: Secondary | ICD-10-CM

## 2017-10-17 DIAGNOSIS — B951 Streptococcus, group B, as the cause of diseases classified elsewhere: Secondary | ICD-10-CM

## 2017-10-17 DIAGNOSIS — R519 Headache, unspecified: Secondary | ICD-10-CM

## 2017-10-17 DIAGNOSIS — Z3400 Encounter for supervision of normal first pregnancy, unspecified trimester: Secondary | ICD-10-CM

## 2017-10-17 NOTE — Progress Notes (Signed)
   PRENATAL VISIT NOTE  Subjective:  Tiffany Ruiz is a 27 y.o. G1P0 at [redacted]w[redacted]d being seen today for ongoing prenatal care.  She is currently monitored for the following issues for this low-risk pregnancy and has Supervision of normal first pregnancy, antepartum and Positive GBS test on their problem list.  Patient reports headache., reports they are now happening all day, every day. Feeling sharp pains occasionally and general discomfort.  Contractions: Not present. Vag. Bleeding: None.   . Denies leaking of fluid.   The following portions of the patient's history were reviewed and updated as appropriate: allergies, current medications, past family history, past medical history, past social history, past surgical history and problem list. Problem list updated.  Objective:   Vitals:   10/17/17 1054  BP: 111/75  Pulse: 77  Weight: 166 lb (75.3 kg)    Fetal Status: Fetal Heart Rate (bpm): 145         General:  Alert, oriented and cooperative. Patient is in no acute distress.  Skin: Skin is warm and dry. No rash noted.   Cardiovascular: Normal heart rate noted  Respiratory: Normal respiratory effort, no problems with respiration noted  Abdomen: Soft, gravid, appropriate for gestational age.  Pain/Pressure: Present     Pelvic: Cervical exam deferred        Extremities: Normal range of motion.     Mental Status: Normal mood and affect. Normal behavior. Normal judgment and thought content.   Assessment and Plan:  Pregnancy: G1P0 at [redacted]w[redacted]d  1. Supervision of normal first pregnancy, antepartum Anatomy scheduled for 11/04/17  2. Positive GBS test ppx in labor  3. Headache -reviewed strategies for improving headaches including better hydration and tylenol  Preterm labor symptoms and general obstetric precautions including but not limited to vaginal bleeding, contractions, leaking of fluid and fetal movement were reviewed in detail with the patient. Please refer to After Visit Summary  for other counseling recommendations.  Return in about 1 month (around 11/14/2017) for OB visit.  Future Appointments  Date Time Provider [place]  11/04/2017  9:30 AM [facility] [place] 1 [facility]    Sloan Leiter, MD

## 2017-10-28 ENCOUNTER — Encounter (HOSPITAL_COMMUNITY): Payer: Self-pay

## 2017-10-28 ENCOUNTER — Ambulatory Visit: Payer: Medicaid Other

## 2017-10-28 VITALS — BP 111/70 | HR 74 | Wt 165.2 lb

## 2017-10-28 DIAGNOSIS — R300 Dysuria: Secondary | ICD-10-CM

## 2017-10-28 DIAGNOSIS — Z3400 Encounter for supervision of normal first pregnancy, unspecified trimester: Secondary | ICD-10-CM | POA: Diagnosis not present

## 2017-10-28 DIAGNOSIS — B951 Streptococcus, group B, as the cause of diseases classified elsewhere: Secondary | ICD-10-CM

## 2017-10-28 LAB — POCT URINALYSIS DIPSTICK
Bilirubin, UA: NEGATIVE
Blood, UA: POSITIVE
Glucose, UA: NEGATIVE
Ketones, UA: POSITIVE
Nitrite, UA: NEGATIVE
PROTEIN UA: POSITIVE — AB
Spec Grav, UA: 1.01 (ref 1.010–1.025)
Urobilinogen, UA: 0.2 E.U./dL
pH, UA: 7 (ref 5.0–8.0)

## 2017-10-28 MED ORDER — NITROFURANTOIN MONOHYD MACRO 100 MG PO CAPS: 100.0000 mg | ORAL_CAPSULE | Freq: Two times a day (BID) | ORAL | 0 refills | Status: DC

## 2017-10-28 NOTE — Progress Notes (Signed)
Pt is here with UTI symptoms. Urine dip is positive for UTI. Dr Jodi Mourning advised send urine for culture and send Macrobid Rx to pharmacy. Pt verbalized understanding.

## 2017-11-01 ENCOUNTER — Other Ambulatory Visit: Payer: Self-pay | Admitting: Obstetrics

## 2017-11-01 DIAGNOSIS — B3741 Candidal cystitis and urethritis: Secondary | ICD-10-CM

## 2017-11-01 LAB — URINE CULTURE

## 2017-11-01 MED ORDER — TERCONAZOLE 0.8 % VA CREA: 1.0000 | TOPICAL_CREAM | Freq: Every day | VAGINAL | 0 refills | Status: DC

## 2017-11-04 ENCOUNTER — Other Ambulatory Visit (HOSPITAL_COMMUNITY): Payer: Self-pay | Admitting: *Deleted

## 2017-11-04 ENCOUNTER — Other Ambulatory Visit: Payer: Self-pay | Admitting: Certified Nurse Midwife

## 2017-11-04 ENCOUNTER — Ambulatory Visit (HOSPITAL_COMMUNITY)
Admission: RE | Admit: 2017-11-04 | Discharge: 2017-11-04 | Disposition: A | Discharge status: Home / Self Care | Payer: Medicaid Other | Source: Ambulatory Visit | Attending: Certified Nurse Midwife | Admitting: Certified Nurse Midwife

## 2017-11-04 DIAGNOSIS — Z3685 Encounter for antenatal screening for Streptococcus B: Secondary | ICD-10-CM | POA: Diagnosis not present

## 2017-11-04 DIAGNOSIS — D259 Leiomyoma of uterus, unspecified: Secondary | ICD-10-CM

## 2017-11-04 DIAGNOSIS — G930 Cerebral cysts: Secondary | ICD-10-CM | POA: Insufficient documentation

## 2017-11-04 DIAGNOSIS — Z363 Encounter for antenatal screening for malformations: Secondary | ICD-10-CM | POA: Diagnosis not present

## 2017-11-04 DIAGNOSIS — Z3400 Encounter for supervision of normal first pregnancy, unspecified trimester: Secondary | ICD-10-CM

## 2017-11-04 DIAGNOSIS — O3412 Maternal care for benign tumor of corpus uteri, second trimester: Secondary | ICD-10-CM | POA: Insufficient documentation

## 2017-11-04 DIAGNOSIS — O3503X0 Maternal care for (suspected) central nervous system malformation or damage in fetus, choroid plexus cysts, not applicable or unspecified: Secondary | ICD-10-CM

## 2017-11-04 DIAGNOSIS — O3410 Maternal care for benign tumor of corpus uteri, unspecified trimester: Secondary | ICD-10-CM

## 2017-11-04 DIAGNOSIS — O359XX0 Maternal care for (suspected) fetal abnormality and damage, unspecified, not applicable or unspecified: Secondary | ICD-10-CM | POA: Diagnosis not present

## 2017-11-04 DIAGNOSIS — Z3A19 19 weeks gestation of pregnancy: Secondary | ICD-10-CM

## 2017-11-04 DIAGNOSIS — O350XX0 Maternal care for (suspected) central nervous system malformation in fetus, not applicable or unspecified: Secondary | ICD-10-CM

## 2017-11-11 ENCOUNTER — Ambulatory Visit (INDEPENDENT_AMBULATORY_CARE_PROVIDER_SITE_OTHER): Payer: Medicaid Other | Admitting: Obstetrics & Gynecology

## 2017-11-11 DIAGNOSIS — IMO0002 Reserved for concepts with insufficient information to code with codable children: Secondary | ICD-10-CM

## 2017-11-11 DIAGNOSIS — O350XX0 Maternal care for (suspected) central nervous system malformation in fetus, not applicable or unspecified: Secondary | ICD-10-CM

## 2017-11-11 DIAGNOSIS — Z3400 Encounter for supervision of normal first pregnancy, unspecified trimester: Secondary | ICD-10-CM

## 2017-11-11 HISTORY — DX: Maternal care for (suspected) central nervous system malformation in fetus, not applicable or unspecified: O35.0XX0

## 2017-11-11 HISTORY — DX: Reserved for concepts with insufficient information to code with codable children: IMO0002

## 2017-11-11 NOTE — Progress Notes (Signed)
PRENATAL VISIT NOTE  Subjective:  Tiffany Ruiz is a 27 y.o. G1P0 at [redacted]w[redacted]d being seen today for ongoing prenatal care.  She is currently monitored for the following issues for this low-risk pregnancy and has Supervision of normal first pregnancy, antepartum; Positive GBS test; and Unilateral choroid plexus cyst of fetus on prenatal ultrasound on their problem list.  Patient reports no complaints.  Contractions: Not present. Vag. Bleeding: None.   . Denies leaking of fluid.   The following portions of the patient's history were reviewed and updated as appropriate: allergies, current medications, past family history, past medical history, past social history, past surgical history and problem list. Problem list updated.  Objective:   Vitals:   11/11/17 0901  BP: 132/84  Pulse: (!) 101  Weight: 170 lb 8 oz (77.3 kg)    Fetal Status: Fetal Heart Rate (bpm): 150         General:  Alert, oriented and cooperative. Patient is in no acute distress.  Skin: Skin is warm and dry. No rash noted.   Cardiovascular: Normal heart rate noted  Respiratory: Normal respiratory effort, no problems with respiration noted  Abdomen: Soft, gravid, appropriate for gestational age.  Pain/Pressure: Absent     Pelvic: Cervical exam deferred        Extremities: Normal range of motion.  Edema: None  Mental Status: Normal mood and affect. Normal behavior. Normal judgment and thought content.   [place] Mfm Ob Detail +14 Wk  Result Date: 11/04/2017 ----------------------------------------------------------------------  OBSTETRICS REPORT                      (Signed Final 11/04/2017 02:45 pm) ---------------------------------------------------------------------- Patient Info  ID #:       [id number]                          D.O.B.:  Feb 18, 1991 (26 yrs)  Name:       Tiffany Ruiz                Visit Date: 11/04/2017 09:35 am ---------------------------------------------------------------------- Performed By  Performed  By:     Novella Rob        Ref. Address:     [address] [address]                                                             [place]                                                             [place]  Attending:  Tama High MD        Location:         [facility]  Referred By:      Morene Crocker CNM ---------------------------------------------------------------------- Orders   #  Description                                 Code   1  [place] MFM OB DETAIL +14 [place]                     [id number]  ----------------------------------------------------------------------   #  Ordered By               Order #        Accession #    Episode #   1  Darron Doom              [id number]      [id number]     [id number]  ---------------------------------------------------------------------- Indications   [redacted] weeks gestation of pregnancy                Z3A.19   Encounter for antenatal screening for          Z36.3   malformations   Encounter for Strep B                          Z36.85   Uterine fibroids affecting pregnancy,          O34.10   unspecified trimestser   Fetal abnormality - other known or             O35.9XX0   suspected : choroid plexus cyst  ---------------------------------------------------------------------- OB History  Blood Type:            Height:  5'2"   Weight (lb):  165       BMI:  30.18  Gravidity:    1 ---------------------------------------------------------------------- Fetal Evaluation  Num Of Fetuses:     1  Cardiac Activity:   Observed  Presentation:       Cephalic  Placenta:           Posterior  P. Cord Insertion:  Visualized, central  Amniotic Fluid  AFI FV:      Subjectively within normal limits                              Largest Pocket(cm)                              4.43 ---------------------------------------------------------------------- Biometry  BPD:      43.9  mm      G. Age:  19w 2d         57  %    CI:        75.68   %    70 - 86                                                          FL/HC:  18.1   %    16.1 - 18.3  HC:       160   mm     G. Age:  18w 6d         28  %    HC/AC:      1.14        1.09 - 1.39  AC:      140.9  mm     G. Age:  19w 3d         106  %    FL/BPD:     66.1   %  FL:         29  mm     G. Age:  18w 6d         35  %    FL/AC:      20.6   %    20 - 24  HUM:        28  mm     G. Age:  19w 0d         47  %  CER:      19.9  mm     G. Age:  19w 0d         46  %  NFT:       5.9  mm  CM:        4.4  mm  Est. FW:     278  gm    0 lb 10 oz      46  % ---------------------------------------------------------------------- Gestational Age  LMP:           19w 1d        Date:  06/23/17                 EDD:   03/30/18  U/S Today:     19w 1d                                        EDD:   03/30/18  Best:          19w 1d     Det. By:  LMP  (06/23/17)          EDD:   03/30/18 ---------------------------------------------------------------------- Anatomy  Cranium:               Appears normal         Aortic Arch:            Appears normal  Cavum:                 Appears normal         Ductal Arch:            Appears normal  Ventricles:            Appears normal         Diaphragm:              Appears normal  Choroid Plexus:        Left choroid           Stomach:                Appears normal, left                         plexus cyst, 4.68mm  sided  Cerebellum:            Appears normal         Abdomen:                Appears normal  Posterior Fossa:       Appears normal         Abdominal Wall:         Appears nml (cord                                                                        insert, abd wall)  Nuchal Fold:           Appears normal         Cord Vessels:           Appears normal (3                                                                        vessel cord)  Face:                   Orbits appear          Kidneys:                Appear normal                         normal; normal                         profile  Lips:                  Not well visualized    Bladder:                Appears normal  Thoracic:              Appears normal         Spine:                  Appears normal  Heart:                 Appears normal         Upper Extremities:      Appears normal                         (4CH, axis, and situs  RVOT:                  Appears normal         Lower Extremities:      Appears normal  LVOT:                  Appears normal  Other:  Parents do not wish to know sex of fetus. Heels visualized.          Technically difficult due to fetal position. ----------------------------------------------------------------------  Cervix Uterus Adnexa  Cervix  Length:           4.05  cm.  Normal appearance by transabdominal scan.  Uterus  Single fibroid noted, see table below.  Left Ovary  Not visualized. No adnexal mass visualized.  Right Ovary  Not visualized. No adnexal mass visualized.  Cul De Sac:   No free fluid seen.  Adnexa:       No abnormality visualized. ---------------------------------------------------------------------- Myomas   Site                     L(cm)      W(cm)      D(cm)      Location   Posterior                3.82       2.86       2.87       Intramural  ----------------------------------------------------------------------   Blood Flow                 RI        PI       Comments  ---------------------------------------------------------------------- Impression  We performed fetal anatomy scan. Unilateral choroid plexus  cyst is seen. No other makers of aneuploidies or fetal  structural defects are seen. Fetal biometry is consistent with  her previously-established dates. Amniotic fluid is normal and  good fetal activity is seen.  A small posterior myoma is seen (see above).  On cell-free fetal DNA screening, the risks of fetal  aneuploidies are not increased.  I informed the  patient that given that she had low rik for fetal  aneuploidies on cell-free fetal DNA screening, finding of CPC  should be considered a normal variant and that the risk of  trisomy 18 is not increased. I also reassured that Wann  resolves with advancing gestation and is not associated with  any intracranial malformations.  Following structure(s) to be evaluated on her next visit:  -Nose and lips ---------------------------------------------------------------------- Recommendations  An appointment was made for her to return in 4 weeks for  completion of fetal anatomy. ----------------------------------------------------------------------                  Tama High, MD Electronically Signed Final Report   11/04/2017 02:45 pm ----------------------------------------------------------------------   Assessment and Plan:  Pregnancy: G1P0 at [redacted]w[redacted]d  1. Unilateral choroid plexus cyst of fetus on prenatal ultrasound Follow up scan ordered  2. Supervision of normal first pregnancy, antepartum Preterm labor symptoms and general obstetric precautions including but not limited to vaginal bleeding, contractions, leaking of fluid and fetal movement were reviewed in detail with the patient. Please refer to After Visit Summary for other counseling recommendations.  Return in about 4 weeks (around 12/09/2017) for OB Visit.  Future Appointments  Date Time Provider [place]  12/09/2017  8:15 AM [facility] [place]    Verita Schneiders, MD

## 2017-11-11 NOTE — Patient Instructions (Signed)
Return to clinic for any scheduled appointments or obstetric concerns, or go to [facility] for evaluation  Second Trimester of Pregnancy The second trimester is from week 13 through week 28, month 4 through 6. This is often the time in pregnancy that you feel your best. Often times, morning sickness has lessened or quit. You may have more energy, and you may get hungry more often. Your unborn baby (fetus) is growing rapidly. At the end of the sixth month, he or she is about 9 inches long and weighs about 1 pounds. You will likely feel the baby move (quickening) between 18 and 20 weeks of pregnancy.  Research childbirth classes and hospital preregistration at [email or web address]  Follow these instructions at home:  Avoid all smoking, herbs, and alcohol. Avoid drugs not approved by your doctor.  Do not use any tobacco products, including cigarettes, chewing tobacco, and electronic cigarettes. If you need help quitting, ask your doctor. You may get counseling or other support to help you quit.  Only take medicine as told by your doctor. Some medicines are safe and some are not during pregnancy.  Exercise only as told by your doctor. Stop exercising if you start having cramps.  Eat regular, healthy meals.  Wear a good support bra if your breasts are tender.  Do not use hot tubs, steam rooms, or saunas.  Wear your seat belt when driving.  Avoid raw meat, uncooked cheese, and liter boxes and soil used by cats.  Take your prenatal vitamins.  Take 1500-2000 milligrams of calcium daily starting at the 20th week of pregnancy until you deliver your baby.  Try taking medicine that helps you poop (stool softener) as needed, and if your doctor approves. Eat more fiber by eating fresh fruit, vegetables, and whole grains. Drink enough fluids to keep your pee (urine) clear or pale yellow.  Take warm water baths (sitz baths) to soothe pain or discomfort caused by hemorrhoids. Use hemorrhoid cream if your  doctor approves.  If you have puffy, bulging veins (varicose veins), wear support hose. Raise (elevate) your feet for 15 minutes, 3-4 times a day. Limit salt in your diet.  Avoid heavy lifting, wear low heals, and sit up straight.  Rest with your legs raised if you have leg cramps or low back pain.  Visit your dentist if you have not gone during your pregnancy. Use a soft toothbrush to brush your teeth. Be gentle when you floss.  You can have sex (intercourse) unless your doctor tells you not to.  Go to your doctor visits.  Get help if:  You feel dizzy.  You have mild cramps or pressure in your lower belly (abdomen).  You have a nagging pain in your belly area.  You continue to feel sick to your stomach (nauseous), throw up (vomit), or have watery poop (diarrhea).  You have bad smelling fluid coming from your vagina.  You have pain with peeing (urination). Get help right away if:  You have a fever.  You are leaking fluid from your vagina.  You have spotting or bleeding from your vagina.  You have severe belly cramping or pain.  You lose or gain weight rapidly.  You have trouble catching your breath and have chest pain.  You notice sudden or extreme puffiness (swelling) of your face, hands, ankles, feet, or legs.  You have not felt the baby move in over an hour.  You have severe headaches that do not go away with medicine.  You have vision changes.  This information is not intended to replace advice given to you by your health care provider. Make sure you discuss any questions you have with your health care provider. Document Released: 06/13/2009 Document Revised: 08/25/2015 Document Reviewed: 05/20/2012 Elsevier Interactive Patient Education  2017 [employer].

## 2017-12-04 ENCOUNTER — Telehealth: Payer: Self-pay

## 2017-12-04 MED ORDER — TERCONAZOLE 0.8 % VA CREA: 1.0000 | TOPICAL_CREAM | Freq: Every day | VAGINAL | 0 refills | Status: DC

## 2017-12-04 NOTE — Telephone Encounter (Signed)
Spoke with patient and she reported vaginal itching/irritation and stated that she is experiencing the same symptoms from when she had a yeast infection. Pt requesting rx. Pt states that she has been eating a lot of sugar, advised to cut back on sugar intake. Sent rx with provider approval.

## 2017-12-09 ENCOUNTER — Ambulatory Visit (HOSPITAL_COMMUNITY)
Admission: RE | Admit: 2017-12-09 | Discharge: 2017-12-09 | Disposition: A | Discharge status: Home / Self Care | Payer: Medicaid Other | Source: Ambulatory Visit | Attending: Obstetrics | Admitting: Obstetrics

## 2017-12-09 ENCOUNTER — Ambulatory Visit (INDEPENDENT_AMBULATORY_CARE_PROVIDER_SITE_OTHER): Payer: Medicaid Other

## 2017-12-09 DIAGNOSIS — O3503X0 Maternal care for (suspected) central nervous system malformation or damage in fetus, choroid plexus cysts, not applicable or unspecified: Secondary | ICD-10-CM

## 2017-12-09 DIAGNOSIS — Z3A24 24 weeks gestation of pregnancy: Secondary | ICD-10-CM | POA: Diagnosis not present

## 2017-12-09 DIAGNOSIS — O3412 Maternal care for benign tumor of corpus uteri, second trimester: Secondary | ICD-10-CM | POA: Insufficient documentation

## 2017-12-09 DIAGNOSIS — D259 Leiomyoma of uterus, unspecified: Secondary | ICD-10-CM | POA: Diagnosis not present

## 2017-12-09 DIAGNOSIS — O283 Abnormal ultrasonic finding on antenatal screening of mother: Secondary | ICD-10-CM | POA: Diagnosis not present

## 2017-12-09 DIAGNOSIS — Z362 Encounter for other antenatal screening follow-up: Secondary | ICD-10-CM | POA: Diagnosis not present

## 2017-12-09 DIAGNOSIS — O359XX0 Maternal care for (suspected) fetal abnormality and damage, unspecified, not applicable or unspecified: Secondary | ICD-10-CM | POA: Diagnosis not present

## 2017-12-09 DIAGNOSIS — Z3400 Encounter for supervision of normal first pregnancy, unspecified trimester: Secondary | ICD-10-CM

## 2017-12-09 DIAGNOSIS — Z3402 Encounter for supervision of normal first pregnancy, second trimester: Secondary | ICD-10-CM

## 2017-12-09 DIAGNOSIS — O350XX0 Maternal care for (suspected) central nervous system malformation in fetus, not applicable or unspecified: Secondary | ICD-10-CM

## 2017-12-09 DIAGNOSIS — O358XX0 Maternal care for other (suspected) fetal abnormality and damage, not applicable or unspecified: Secondary | ICD-10-CM | POA: Diagnosis present

## 2017-12-09 DIAGNOSIS — O3410 Maternal care for benign tumor of corpus uteri, unspecified trimester: Secondary | ICD-10-CM | POA: Diagnosis not present

## 2017-12-09 IMAGING — US US MFM OB FOLLOW-UP
1 series · 14 of 28 positions shown · non-contrast
Comparison: none

[Series 1: us mfm ob follow-up · 35 acquisitions, 14 frames shown]
[im 2/35]
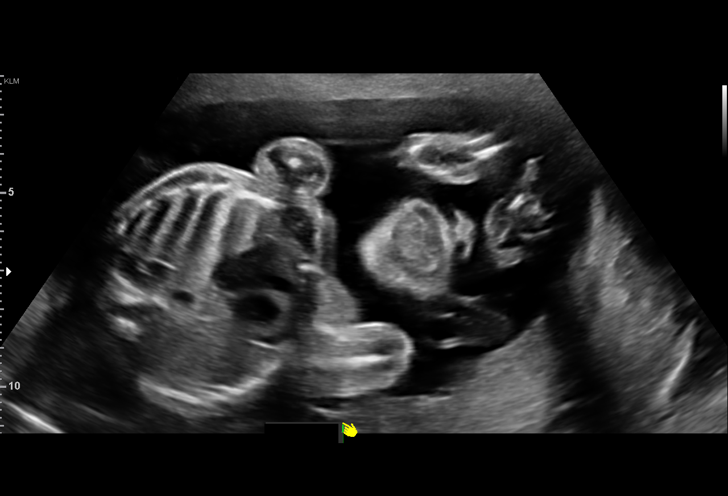
[im 4/35]
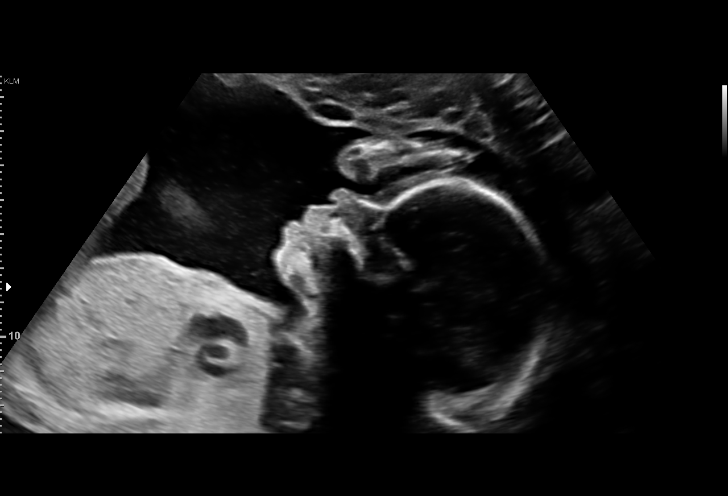
[im 7/35]
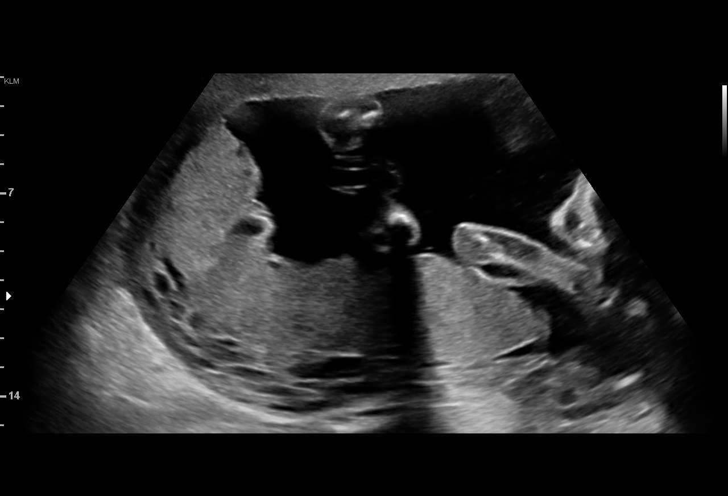
[im 9/35]
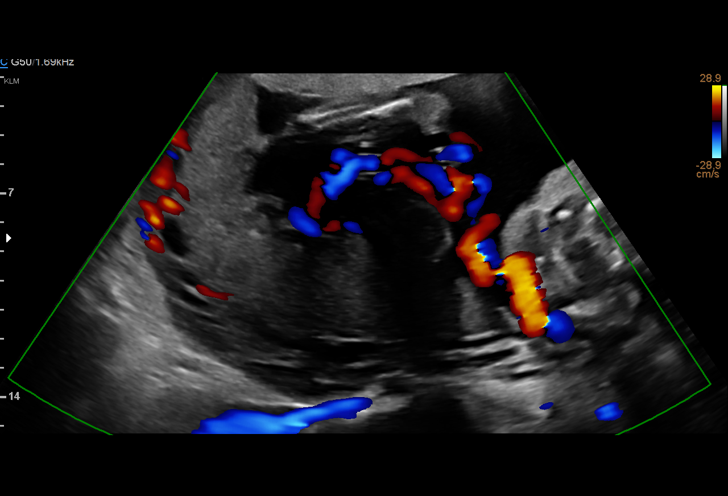
[im 12/35]
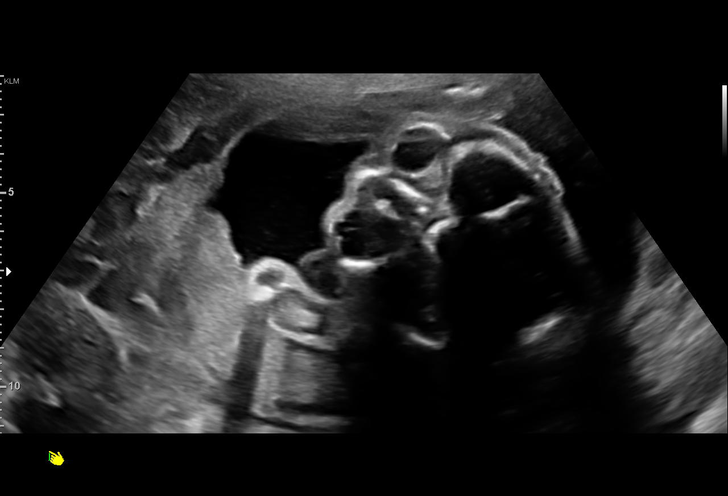
[im 14/35]
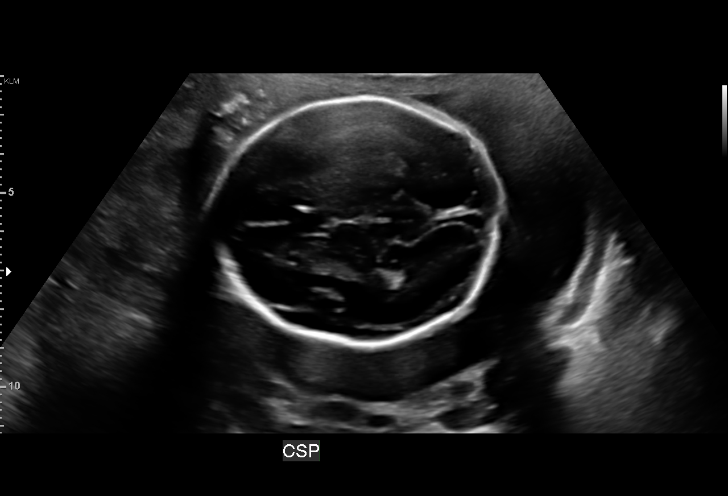
[im 17/35]
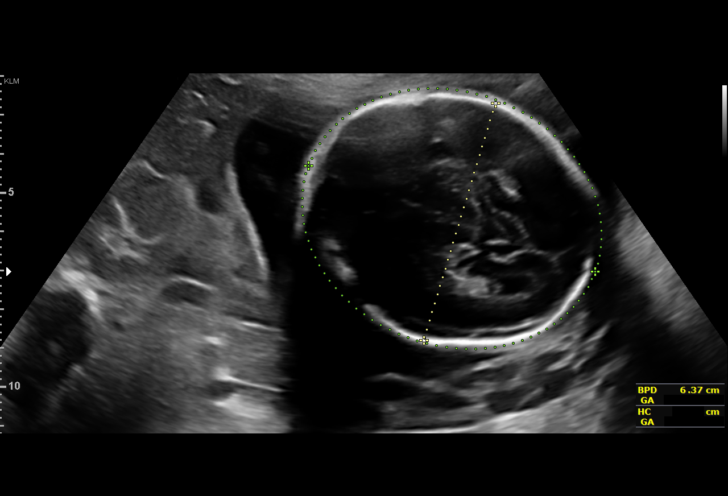
[im 19/35]
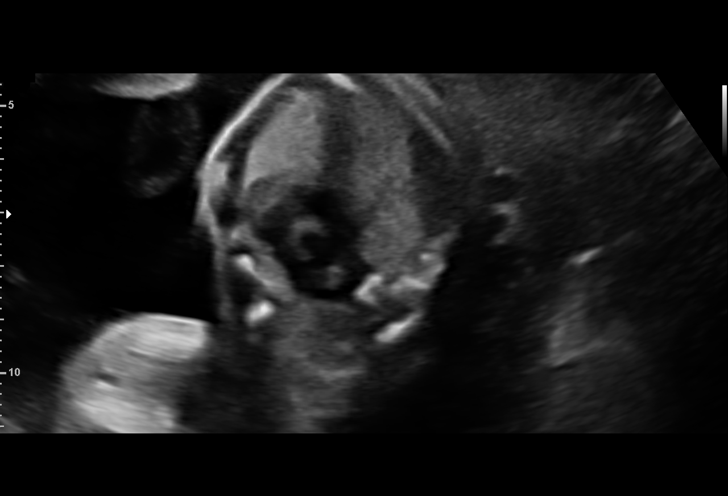
[im 22/35]
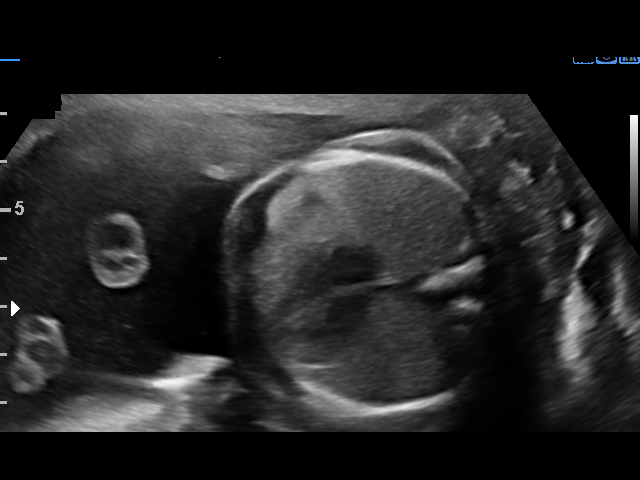
[im 24/35]
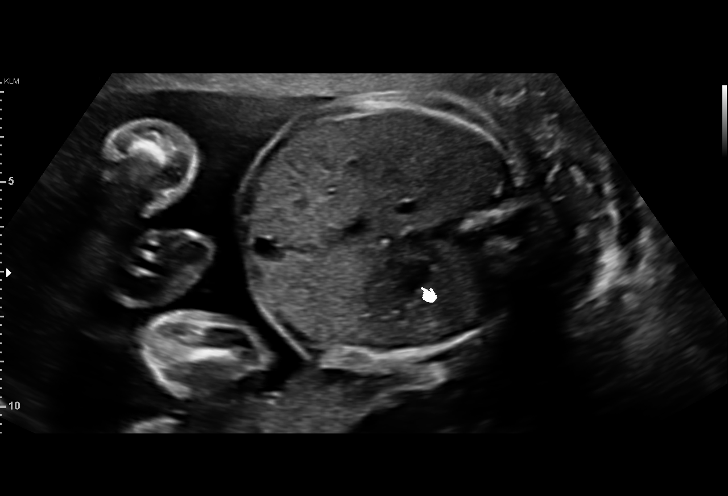
[im 27/35]
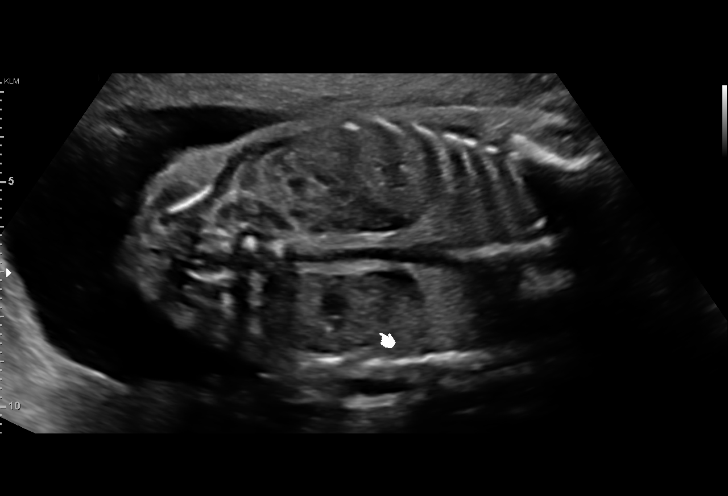
[im 29/35]
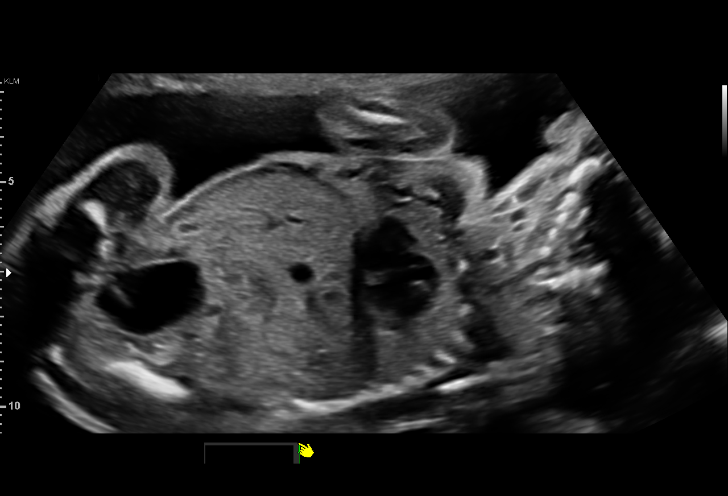
[im 32/35]
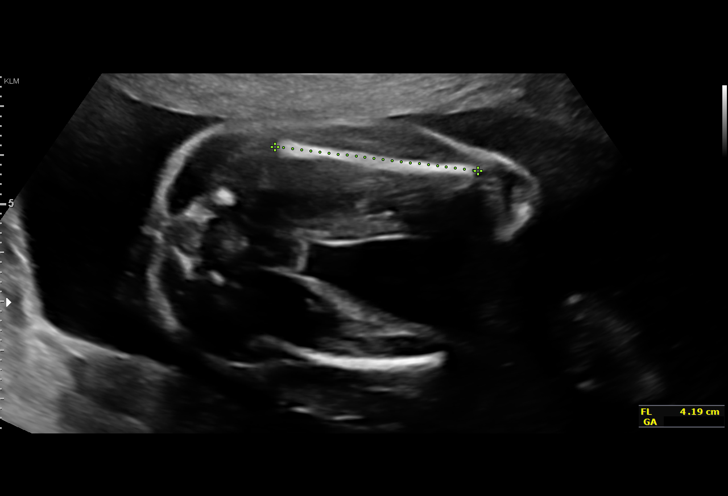
[im 35/35]
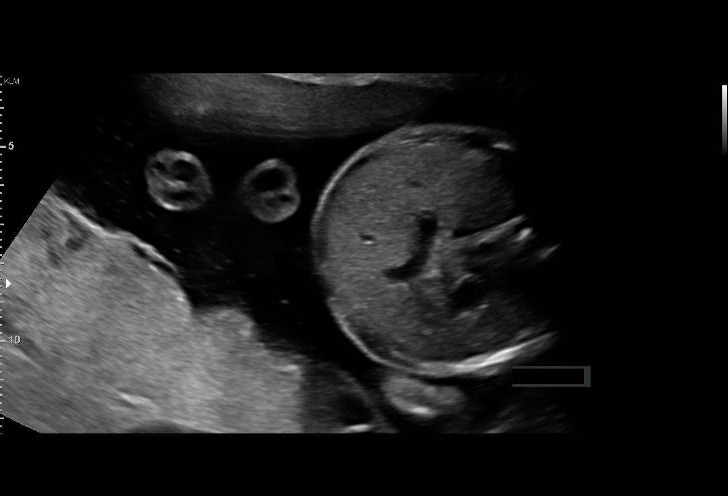

[14 of 28 positions shown; findings below may reference images not displayed]

Road [HOSPITAL]

Indications

Antenatal follow-up for nonvisualized fetal    [DY]
anatomy
24 weeks gestation of pregnancy
Encounter for Strep B                          [DY]
Uterine fibroids affecting pregnancy,          [DY]
unspecified trimestser
Fetal abnormality - other known or             [DY]
suspected : choroid plexus cyst
Vital Signs

Height:        5'2"
Fetal Evaluation

Num Of Fetuses:         1
Fetal Heart Rate(bpm):  148
Cardiac Activity:       Observed
Presentation:           Cephalic
Placenta:               Posterior
P. Cord Insertion:      Visualized

Amniotic Fluid
AFI FV:      Within normal limits

Largest Pocket(cm)
6.3
Biometry

BPD:      63.5  mm     G. Age:  25w 5d         90  %    CI:        77.88   %    70 - 86
FL/HC:      18.4   %    18.7 -
HC:      227.7  mm     G. Age:  24w 5d         56  %    HC/AC:      1.13        1.05 -
AC:      201.3  mm     G. Age:  24w 5d         60  %    FL/BPD:     66.1   %    71 - 87
FL:         42  mm     G. Age:  23w 5d         24  %    FL/AC:      20.9   %    20 - 24

Est. FW:     694  gm      1 lb 8 oz     57  %
OB History

Gravidity:    1
Gestational Age

LMP:           24w 1d        Date:  [DATE]                 EDD:   [DATE]
U/S Today:     24w 5d                                        EDD:   [DATE]
Best:          24w 1d     Det. By:  LMP  ([DATE])          EDD:   [DATE]
Anatomy

Cranium:               Appears normal         Aortic Arch:            Appears normal
Cavum:                 Appears normal         Ductal Arch:            Appears normal
Ventricles:            Appears normal         Diaphragm:              Appears normal
Choroid Plexus:        Appears normal         Stomach:                Appears normal, left
sided
Cerebellum:            Previously seen        Abdomen:                Appears normal
Posterior Fossa:       Previously seen        Abdominal Wall:         Previously seen
Nuchal Fold:           Previously seen        Cord Vessels:           Previously seen
Face:                  Appears normal         Kidneys:                Appear normal
(orbits and profile)
Lips:                  Appears normal         Bladder:                Appears normal
Thoracic:              Appears normal         Spine:                  Appears normal
Heart:                 Appears normal         Upper Extremities:      Previously seen
(4CH, axis, and situs
RVOT:                  Appears normal         Lower Extremities:      Previously seen
LVOT:                  Previously seen

Other:  Parents do not wish to know sex of fetus. Heels previously visualized.
Technically difficult due to fetal position.
Cervix Uterus Adnexa

Cervix
Length:            4.2  cm.
Normal appearance by transabdominal scan.

Uterus
Single fibroid previously seen

Adnexa
No abnormality visualized.
Comments

U/S images reviewed. Appropriate fetal growth is noted.  No
fetal abnormalities are seen.
Choroid plexus cyst has resolved.
Recommendations: 1) Follow-up as clinically indicated
Recommendations

1) Follow-up as clinically indicated

## 2017-12-09 NOTE — Progress Notes (Signed)
   PRENATAL VISIT NOTE  Subjective:  Tiffany Ruiz is a 27 y.o. G1P0 at [redacted]w[redacted]d being seen today for ongoing prenatal care.  She is currently monitored for the following issues for this low-risk pregnancy and has Supervision of normal first pregnancy, antepartum; Positive GBS test; and Unilateral choroid plexus cyst of fetus on prenatal ultrasound on their problem list.  Patient reports no complaints.  Contractions: Not present. Vag. Bleeding: None.  Movement: Present. Denies leaking of fluid.   The following portions of the patient's history were reviewed and updated as appropriate: allergies, current medications, past family history, past medical history, past social history, past surgical history and problem list. Problem list updated.  Objective:   Vitals:   12/09/17 0942  BP: 125/86  Pulse: 92  Weight: 175 lb 3.2 oz (79.5 kg)    Fetal Status: Fetal Heart Rate (bpm): 143 Fundal Height: 25 cm Movement: Present     General:  Alert, oriented and cooperative. Patient is in no acute distress.  Skin: Skin is warm and dry. No rash noted.   Cardiovascular: Normal heart rate noted  Respiratory: Normal respiratory effort, no problems with respiration noted  Abdomen: Soft, gravid, appropriate for gestational age.  Pain/Pressure: Present     Pelvic: Cervical exam deferred        Extremities: Normal range of motion.  Edema: None  Mental Status: Normal mood and affect. Normal behavior. Normal judgment and thought content.   Assessment and Plan:  Pregnancy: G1P0 at [redacted]w[redacted]d  1. Supervision of normal first pregnancy, antepartum - No complaints. Routine care - Labs and GTT next week. Reviewed TDAP and flu injection next visit.   Preterm labor symptoms and general obstetric precautions including but not limited to vaginal bleeding, contractions, leaking of fluid and fetal movement were reviewed in detail with the patient. Please refer to After Visit Summary for other counseling recommendations.   Return in about 4 weeks (around 01/06/2018) for Return OB visit, 2hr GTT and labs.  Future Appointments  Date Time Provider [place]  01/06/2018  8:15 AM [facility] None  01/06/2018  9:15 AM [place], Kathie Dike, CNM [facility] None    Wende Mott, [place]  12/09/17 10:55 AM

## 2017-12-09 NOTE — Patient Instructions (Signed)

## 2017-12-12 ENCOUNTER — Ambulatory Visit (INDEPENDENT_AMBULATORY_CARE_PROVIDER_SITE_OTHER): Payer: Medicaid Other

## 2017-12-12 ENCOUNTER — Other Ambulatory Visit (HOSPITAL_COMMUNITY)
Admission: RE | Admit: 2017-12-12 | Discharge: 2017-12-12 | Disposition: A | Discharge status: Home / Self Care | Payer: Medicaid Other | Source: Ambulatory Visit | Attending: Obstetrics | Admitting: Obstetrics

## 2017-12-12 VITALS — BP 135/91 | HR 102 | Wt 176.6 lb

## 2017-12-12 DIAGNOSIS — R300 Dysuria: Secondary | ICD-10-CM | POA: Diagnosis not present

## 2017-12-12 DIAGNOSIS — N898 Other specified noninflammatory disorders of vagina: Secondary | ICD-10-CM

## 2017-12-12 LAB — POCT URINALYSIS DIPSTICK
BILIRUBIN UA: NEGATIVE
Glucose, UA: NEGATIVE
KETONES UA: NEGATIVE
Nitrite, UA: NEGATIVE
Protein, UA: NEGATIVE
Spec Grav, UA: 1.01 (ref 1.010–1.025)
Urobilinogen, UA: 0.2 E.U./dL
pH, UA: 7 (ref 5.0–8.0)

## 2017-12-12 NOTE — Progress Notes (Signed)
Pt is here with c/o cloudy urine and vaginal discharge that has been going on for a week. Pt used Tindamax and has not had any relief. Self swab today and urine dip/culture. Advised pt we will notify her of the results. Pt denies any contractions or bleeding, good FM.

## 2017-12-13 LAB — CERVICOVAGINAL ANCILLARY ONLY
Bacterial vaginitis: NEGATIVE
CANDIDA VAGINITIS: POSITIVE — AB
CHLAMYDIA, DNA PROBE: NEGATIVE
NEISSERIA GONORRHEA: NEGATIVE
TRICH (WINDOWPATH): NEGATIVE

## 2017-12-14 ENCOUNTER — Other Ambulatory Visit: Payer: Self-pay | Admitting: Obstetrics

## 2017-12-14 LAB — URINE CULTURE

## 2017-12-18 ENCOUNTER — Telehealth: Payer: Self-pay

## 2017-12-18 NOTE — Telephone Encounter (Signed)
Pt did not have good results with last yeast cream Rx Pt requesting different Rx treatment for yeast (Pill) Since not on OB protocol pt made aware I had to consult w/ a provider  Regarding her request.

## 2017-12-18 NOTE — Telephone Encounter (Signed)
Please see results from 12/12/17 and advise pt is calling regarding results and treatment.

## 2017-12-18 NOTE — Telephone Encounter (Signed)
Pt calling regarding results from 12/12/17 Results showed yeast  Please advise.

## 2017-12-18 NOTE — Telephone Encounter (Signed)
Results: 1. Urine culture is negative 2. Wet prep is positive for yeast only, and she was treated for yeast.  Everything else is negative. 3.  GC/CH cultures were negative

## 2017-12-19 ENCOUNTER — Other Ambulatory Visit: Payer: Self-pay

## 2017-12-19 ENCOUNTER — Other Ambulatory Visit: Payer: Self-pay | Admitting: Obstetrics

## 2017-12-19 DIAGNOSIS — N898 Other specified noninflammatory disorders of vagina: Secondary | ICD-10-CM

## 2017-12-19 MED ORDER — TERCONAZOLE 0.4 % VA CREA: 1.0000 | TOPICAL_CREAM | Freq: Every day | VAGINAL | 0 refills | Status: DC

## 2017-12-19 NOTE — Progress Notes (Signed)
Pt needs Rx sent in for yeast  Tried 3 day before no relief per pt  Pt cannot be Rx a pill for yeast during pregnancy  Pt made aware and voiced understanding.

## 2017-12-19 NOTE — Telephone Encounter (Signed)
No other treatment is recommended in pregnancy for vaginal yeast.

## 2018-01-06 ENCOUNTER — Other Ambulatory Visit: Payer: Medicaid Other

## 2018-01-06 ENCOUNTER — Ambulatory Visit (INDEPENDENT_AMBULATORY_CARE_PROVIDER_SITE_OTHER): Payer: Medicaid Other | Admitting: Advanced Practice Midwife

## 2018-01-06 VITALS — Wt 179.7 lb

## 2018-01-06 DIAGNOSIS — B372 Candidiasis of skin and nail: Secondary | ICD-10-CM

## 2018-01-06 DIAGNOSIS — Z3403 Encounter for supervision of normal first pregnancy, third trimester: Secondary | ICD-10-CM | POA: Diagnosis not present

## 2018-01-06 MED ORDER — NYSTATIN 100000 UNIT/GM EX POWD: Freq: Three times a day (TID) | CUTANEOUS | 0 refills | Status: DC

## 2018-01-06 NOTE — Progress Notes (Signed)
Pt presents for a ROB. Pt states that she has a rash under her left breast. She states that the rash is itchy, and red.

## 2018-01-06 NOTE — Patient Instructions (Signed)
Skin Yeast Infection Skin yeast infection is a condition in which there is an overgrowth of yeast (candida) that normally lives on the skin. This condition usually occurs in areas of the skin that are constantly warm and moist, such as the armpits or the groin. What are the causes? This condition is caused by a change in the normal balance of the yeast and bacteria that live on the skin. What increases the risk? This condition is more likely to develop in:  People who are obese.  Pregnant women.  Women who take birth control pills.  People who have diabetes.  People who take antibiotic medicines.  People who take steroid medicines.  People who are malnourished.  People who have a weak defense (immune) system.  People who are 65 years of age or older.  What are the signs or symptoms? Symptoms of this condition include:  A red, swollen area of the skin.  Bumps on the skin.  Itchiness.  How is this diagnosed? This condition is diagnosed with a medical history and physical exam. Your health care provider may check for yeast by taking light scrapings of the skin to be viewed under a microscope. How is this treated? This condition is treated with medicine. Medicines may be prescribed or be available over-the-counter. The medicines may be:  Taken by mouth (orally).  Applied as a cream.  Follow these instructions at home:  Take or apply over-the-counter and prescription medicines only as told by your health care provider.  Eat more yogurt. This may help to keep your yeast infection from returning.  Maintain a healthy weight. If you need help losing weight, talk with your health care provider.  Keep your skin clean and dry.  If you have diabetes, keep your blood sugar under control. Contact a health care provider if:  Your symptoms go away and then return.  Your symptoms do not get better with treatment.  Your symptoms get worse.  Your rash spreads.  You have a  fever or chills.  You have new symptoms.  You have new warmth or redness of your skin. This information is not intended to replace advice given to you by your health care provider. Make sure you discuss any questions you have with your health care provider. Document Released: 12/05/2010 Document Revised: 11/13/2015 Document Reviewed: 09/20/2014 Elsevier Interactive Patient Education  2018 [employer]vier Inc[employer]  

## 2018-01-06 NOTE — Progress Notes (Signed)
   PRENATAL VISIT NOTE  Subjective:  Tiffany Ruiz is a 27 y.o. G1P0 at [redacted]w[redacted]d being seen today for ongoing prenatal care.  She is currently monitored for the following issues for this low-risk pregnancy and has Supervision of normal first pregnancy, antepartum; Positive GBS test; and Unilateral choroid plexus cyst of fetus on prenatal ultrasound on their problem list.  Patient reports rash under breast.  Contractions: Not present. Vag. Bleeding: None.  Movement: Present. Denies leaking of fluid.   The following portions of the patient's history were reviewed and updated as appropriate: allergies, current medications, past family history, past medical history, past social history, past surgical history and problem list. Problem list updated.  Objective:   Vitals:   01/06/18 0833  Weight: 81.5 kg    Fetal Status: Fetal Heart Rate (bpm): 145 Fundal Height: 29 cm Movement: Present     General:  Alert, oriented and cooperative. Patient is in no acute distress.  Skin: Skin is warm and dry. No rash noted.   Cardiovascular: Normal heart rate noted  Respiratory: Normal respiratory effort, no problems with respiration noted  Abdomen: Soft, gravid, appropriate for gestational age.  Pain/Pressure: Present     Pelvic: Cervical exam deferred        Extremities: Normal range of motion.  Edema: Trace  Mental Status: Normal mood and affect. Normal behavior. Normal judgment and thought content.   Assessment and Plan:  Pregnancy: G1P0 at [redacted]w[redacted]d  1. Encounter for supervision of normal first pregnancy in third trimester --Anticipatory guidance about next visits/weeks of pregnancy given. --Occasional sharp bilateral inguinal pain with movement. Improved with pregnancy support belt. Discussed aches and pains of pregnancy and reasons to seek care.  Increase PO fluids.  Rest/ice/heat/warm bath/Tylenol in addition to support belt for pain. - Glucose Tolerance, 2 Hours w/1 Hour - CBC - HIV Antibody  (routine testing w rflx) - RPR  2. Skin candidiasis --Edema, with satellite patches of edema under left breast and between breasts. Scaling noted on skin surface. - nystatin (MYCOSTATIN/NYSTOP) powder; Apply topically 3 (three) times daily.  Dispense: 15 g; Refill: 0 Preterm labor symptoms and general obstetric precautions including but not limited to vaginal bleeding, contractions, leaking of fluid and fetal movement were reviewed in detail with the patient. Please refer to After Visit Summary for other counseling recommendations.  Return in about 2 weeks (around 01/20/2018).  Future Appointments  Date Time Provider [place]  01/06/2018  9:15 AM Leftwich-Kirby, Kathie Dike, CNM [facility] None    Fatima Blank, CNM

## 2018-01-07 ENCOUNTER — Telehealth: Payer: Self-pay | Admitting: Advanced Practice Midwife

## 2018-01-07 ENCOUNTER — Encounter: Payer: Self-pay | Admitting: Advanced Practice Midwife

## 2018-01-07 LAB — CBC
HEMATOCRIT: 28.5 % — AB (ref 34.0–46.6)
HEMOGLOBIN: 9.4 g/dL — AB (ref 11.1–15.9)
MCH: 29 pg (ref 26.6–33.0)
MCHC: 33 g/dL (ref 31.5–35.7)
MCV: 88 fL (ref 79–97)
PLATELETS: 155 10*3/uL (ref 150–450)
RBC: 3.24 x10E6/uL — ABNORMAL LOW (ref 3.77–5.28)
RDW: 14.2 % (ref 12.3–15.4)
WBC: 6.8 10*3/uL (ref 3.4–10.8)

## 2018-01-07 LAB — HIV ANTIBODY (ROUTINE TESTING W REFLEX): HIV Screen 4th Generation wRfx: NONREACTIVE

## 2018-01-07 LAB — GLUCOSE TOLERANCE, 2 HOURS W/ 1HR
Glucose, 1 hour: 138 mg/dL (ref 65–179)
Glucose, 2 hour: 92 mg/dL (ref 65–152)
Glucose, Fasting: 73 mg/dL (ref 65–91)

## 2018-01-07 LAB — RPR: RPR: NONREACTIVE

## 2018-01-07 MED ORDER — FERROUS SULFATE 325 (65 FE) MG PO TABS: 325.0000 mg | ORAL_TABLET | Freq: Every day | ORAL | 5 refills | Status: AC

## 2018-01-07 MED ORDER — PRENATAL MULTI +DHA 27-0.8-228 MG PO CAPS: 1.0000 | ORAL_CAPSULE | Freq: Every day | ORAL | 10 refills | Status: DC

## 2018-01-07 NOTE — Telephone Encounter (Signed)
Iron supplement and Rx for new PNV with DHA sent to pt pharmacy. MyChart message sent to pt.

## 2018-01-20 ENCOUNTER — Ambulatory Visit (INDEPENDENT_AMBULATORY_CARE_PROVIDER_SITE_OTHER): Payer: Medicaid Other | Admitting: Advanced Practice Midwife

## 2018-01-20 ENCOUNTER — Encounter: Payer: Self-pay | Admitting: Advanced Practice Midwife

## 2018-01-20 VITALS — BP 113/72 | HR 80 | Wt 182.8 lb

## 2018-01-20 DIAGNOSIS — L249 Irritant contact dermatitis, unspecified cause: Secondary | ICD-10-CM

## 2018-01-20 DIAGNOSIS — Z3403 Encounter for supervision of normal first pregnancy, third trimester: Secondary | ICD-10-CM

## 2018-01-20 DIAGNOSIS — O99013 Anemia complicating pregnancy, third trimester: Secondary | ICD-10-CM

## 2018-01-20 MED ORDER — HYDROCORTISONE 0.5 % EX CREA: 1.0000 "application " | TOPICAL_CREAM | Freq: Two times a day (BID) | CUTANEOUS | 0 refills | Status: DC

## 2018-01-20 MED ORDER — DOCUSATE SODIUM 100 MG PO CAPS: 100.0000 mg | ORAL_CAPSULE | Freq: Two times a day (BID) | ORAL | 2 refills | Status: DC | PRN

## 2018-01-20 NOTE — Progress Notes (Signed)
   PRENATAL VISIT NOTE  Subjective:  Tiffany Ruiz is a 27 y.o. G1P0 at [redacted]w[redacted]d being seen today for ongoing prenatal care.  She is currently monitored for the following issues for this low-risk pregnancy and has Supervision of normal first pregnancy, antepartum; Positive GBS test; and Unilateral choroid plexus cyst of fetus on prenatal ultrasound on their problem list.  Patient reports no complaints.   .  .   . Denies leaking of fluid.   The following portions of the patient's history were reviewed and updated as appropriate: allergies, current medications, past family history, past medical history, past social history, past surgical history and problem list. Problem list updated.  Objective:  There were no vitals filed for this visit.  Fetal Status:           General:  Alert, oriented and cooperative. Patient is in no acute distress.  Skin: Skin is warm and dry. No rash noted.   Cardiovascular: Normal heart rate noted  Respiratory: Normal respiratory effort, no problems with respiration noted  Abdomen: Soft, gravid, appropriate for gestational age.        Pelvic: Cervical exam deferred        Extremities: Normal range of motion.     Mental Status: Normal mood and affect. Normal behavior. Normal judgment and thought content.   Assessment and Plan:  Pregnancy: G1P0 at [redacted]w[redacted]d   1. Encounter for supervision of normal first pregnancy in third trimester --Anticipatory guidance about next visits/weeks of pregnancy given.  2. Irritant contact dermatitis, unspecified trigger --Rash on both lower legs and inner thighs.  None on abdomen or elsewhere.  Mild erythema and pruritis.  Most c/w contact dermatitis, no known irritant.  Try topical hydrocortisone, call office if not improved or symptoms on other parts of body. Pt had breast rash, now resolved with nystatin.  Both may have started as contact dermatitis but yeast developed under breasts. - hydrocortisone cream 0.5 %; Apply 1 application  topically 2 (two) times daily.  Dispense: 30 g; Refill: 0  3. Anemia affecting pregnancy in third trimester --Pt taking new PNV with more iron and daily ferrous sulfate tablet.  Take with food. Take at separate times.  Increase PO fluids and fiber.  Colace PRN. - docusate sodium (COLACE) 100 MG capsule; Take 1 capsule (100 mg total) by mouth 2 (two) times daily as needed.  Dispense: 30 capsule; Refill: 2  Preterm labor symptoms and general obstetric precautions including but not limited to vaginal bleeding, contractions, leaking of fluid and fetal movement were reviewed in detail with the patient. Please refer to After Visit Summary for other counseling recommendations.  No follow-ups on file.  No future appointments.  Fatima Blank, CNM

## 2018-01-20 NOTE — Patient Instructions (Signed)
Third Trimester of Pregnancy The third trimester is from week 28 through week 40 (months 7 through 9). The third trimester is a time when the unborn baby (fetus) is growing rapidly. At the end of the ninth month, the fetus is about 20 inches in length and weighs 6-10 pounds. Body changes during your third trimester Your body will continue to go through many changes during pregnancy. The changes vary from woman to woman. During the third trimester:  Your weight will continue to increase. You can expect to gain 25-35 pounds (11-16 kg) by the end of the pregnancy.  You may begin to get stretch marks on your hips, abdomen, and breasts.  You may urinate more often because the fetus is moving lower into your pelvis and pressing on your bladder.  You may develop or continue to have heartburn. This is caused by increased hormones that slow down muscles in the digestive tract.  You may develop or continue to have constipation because increased hormones slow digestion and cause the muscles that push waste through your intestines to relax.  You may develop hemorrhoids. These are swollen veins (varicose veins) in the rectum that can itch or be painful.  You may develop swollen, bulging veins (varicose veins) in your legs.  You may have increased body aches in the pelvis, back, or thighs. This is due to weight gain and increased hormones that are relaxing your joints.  You may have changes in your hair. These can include thickening of your hair, rapid growth, and changes in texture. Some women also have hair loss during or after pregnancy, or hair that feels dry or thin. Your hair will most likely return to normal after your baby is born.  Your breasts will continue to grow and they will continue to become tender. A yellow fluid (colostrum) may leak from your breasts. This is the first milk you are producing for your baby.  Your belly button may stick out.  You may notice more swelling in your hands,  face, or ankles.  You may have increased tingling or numbness in your hands, arms, and legs. The skin on your belly may also feel numb.  You may feel short of breath because of your expanding uterus.  You may have more problems sleeping. This can be caused by the size of your belly, increased need to urinate, and an increase in your body's metabolism.  You may notice the fetus "dropping," or moving lower in your abdomen (lightening).  You may have increased vaginal discharge.  You may notice your joints feel loose and you may have pain around your pelvic bone.  What to expect at prenatal visits You will have prenatal exams every 2 weeks until week 36. Then you will have weekly prenatal exams. During a routine prenatal visit:  You will be weighed to make sure you and the baby are growing normally.  Your blood pressure will be taken.  Your abdomen will be measured to track your baby's growth.  The fetal heartbeat will be listened to.  Any test results from the previous visit will be discussed.  You may have a cervical check near your due date to see if your cervix has softened or thinned (effaced).  You will be tested for Group B streptococcus. This happens between 35 and 37 weeks.  Your health care provider may ask you:  What your birth plan is.  How you are feeling.  If you are feeling the baby move.  If you have had   any abnormal symptoms, such as leaking fluid, bleeding, severe headaches, or abdominal cramping.  If you are using any tobacco products, including cigarettes, chewing tobacco, and electronic cigarettes.  If you have any questions.  Other tests or screenings that may be performed during your third trimester include:  Blood tests that check for low iron levels (anemia).  Fetal testing to check the health, activity level, and growth of the fetus. Testing is done if you have certain medical conditions or if there are problems during the  pregnancy.  Nonstress test (NST). This test checks the health of your baby to make sure there are no signs of problems, such as the baby not getting enough oxygen. During this test, a belt is placed around your belly. The baby is made to move, and its heart rate is monitored during movement.  What is false labor? False labor is a condition in which you feel small, irregular tightenings of the muscles in the womb (contractions) that usually go away with rest, changing position, or drinking water. These are called Braxton Hicks contractions. Contractions may last for hours, days, or even weeks before true labor sets in. If contractions come at regular intervals, become more frequent, increase in intensity, or become painful, you should see your health care provider. What are the signs of labor?  Abdominal cramps.  Regular contractions that start at 10 minutes apart and become stronger and more frequent with time.  Contractions that start on the top of the uterus and spread down to the lower abdomen and back.  Increased pelvic pressure and dull back pain.  A watery or bloody mucus discharge that comes from the vagina.  Leaking of amniotic fluid. This is also known as your "water breaking." It could be a slow trickle or a gush. Let your health care provider know if it has a color or strange odor. If you have any of these signs, call your health care provider right away, even if it is before your due date. Follow these instructions at home: Medicines  Follow your health care provider's instructions regarding medicine use. Specific medicines may be either safe or unsafe to take during pregnancy.  Take a prenatal vitamin that contains at least 600 micrograms (mcg) of folic acid.  If you develop constipation, try taking a stool softener if your health care provider approves. Eating and drinking  Eat a balanced diet that includes fresh fruits and vegetables, whole grains, good sources of protein  such as meat, eggs, or tofu, and low-fat dairy. Your health care provider will help you determine the amount of weight gain that is right for you.  Avoid raw meat and uncooked cheese. These carry germs that can cause birth defects in the baby.  If you have low calcium intake from food, talk to your health care provider about whether you should take a daily calcium supplement.  Eat four or five small meals rather than three large meals a day.  Limit foods that are high in fat and processed sugars, such as fried and sweet foods.  To prevent constipation: ? Drink enough fluid to keep your urine clear or pale yellow. ? Eat foods that are high in fiber, such as fresh fruits and vegetables, whole grains, and beans. Activity  Exercise only as directed by your health care provider. Most women can continue their usual exercise routine during pregnancy. Try to exercise for 30 minutes at least 5 days a week. Stop exercising if you experience uterine contractions.  Avoid heavy   lifting.  Do not exercise in extreme heat or humidity, or at high altitudes.  Wear low-heel, comfortable shoes.  Practice good posture.  You may continue to have sex unless your health care provider tells you otherwise. Relieving pain and discomfort  Take frequent breaks and rest with your legs elevated if you have leg cramps or low back pain.  Take warm sitz baths to soothe any pain or discomfort caused by hemorrhoids. Use hemorrhoid cream if your health care provider approves.  Wear a good support bra to prevent discomfort from breast tenderness.  If you develop varicose veins: ? Wear support pantyhose or compression stockings as told by your healthcare provider. ? Elevate your feet for 15 minutes, 3-4 times a day. Prenatal care  Write down your questions. Take them to your prenatal visits.  Keep all your prenatal visits as told by your health care provider. This is important. Safety  Wear your seat belt at  all times when driving.  Make a list of emergency phone numbers, including numbers for family, friends, the hospital, and police and fire departments. General instructions  Avoid cat litter boxes and soil used by cats. These carry germs that can cause birth defects in the baby. If you have a cat, ask someone to clean the litter box for you.  Do not travel far distances unless it is absolutely necessary and only with the approval of your health care provider.  Do not use hot tubs, steam rooms, or saunas.  Do not drink alcohol.  Do not use any products that contain nicotine or tobacco, such as cigarettes and e-cigarettes. If you need help quitting, ask your health care provider.  Do not use any medicinal herbs or unprescribed drugs. These chemicals affect the formation and growth of the baby.  Do not douche or use tampons or scented sanitary pads.  Do not cross your legs for long periods of time.  To prepare for the arrival of your baby: ? Take prenatal classes to understand, practice, and ask questions about labor and delivery. ? Make a trial run to the hospital. ? Visit the hospital and tour the maternity area. ? Arrange for maternity or paternity leave through employers. ? Arrange for family and friends to take care of pets while you are in the hospital. ? Purchase a rear-facing car seat and make sure you know how to install it in your car. ? Pack your hospital bag. ? Prepare the baby's nursery. Make sure to remove all pillows and stuffed animals from the baby's crib to prevent suffocation.  Visit your dentist if you have not gone during your pregnancy. Use a soft toothbrush to brush your teeth and be gentle when you floss. Contact a health care provider if:  You are unsure if you are in labor or if your water has broken.  You become dizzy.  You have mild pelvic cramps, pelvic pressure, or nagging pain in your abdominal area.  You have lower back pain.  You have persistent  nausea, vomiting, or diarrhea.  You have an unusual or bad smelling vaginal discharge.  You have pain when you urinate. Get help right away if:  Your water breaks before 37 weeks.  You have regular contractions less than 5 minutes apart before 37 weeks.  You have a fever.  You are leaking fluid from your vagina.  You have spotting or bleeding from your vagina.  You have severe abdominal pain or cramping.  You have rapid weight loss or weight gain.    You have shortness of breath with chest pain.  You notice sudden or extreme swelling of your face, hands, ankles, feet, or legs.  Your baby makes fewer than 10 movements in 2 hours.  You have severe headaches that do not go away when you take medicine.  You have vision changes. Summary  The third trimester is from week 28 through week 40, months 7 through 9. The third trimester is a time when the unborn baby (fetus) is growing rapidly.  During the third trimester, your discomfort may increase as you and your baby continue to gain weight. You may have abdominal, leg, and back pain, sleeping problems, and an increased need to urinate.  During the third trimester your breasts will keep growing and they will continue to become tender. A yellow fluid (colostrum) may leak from your breasts. This is the first milk you are producing for your baby.  False labor is a condition in which you feel small, irregular tightenings of the muscles in the womb (contractions) that eventually go away. These are called Braxton Hicks contractions. Contractions may last for hours, days, or even weeks before true labor sets in.  Signs of labor can include: abdominal cramps; regular contractions that start at 10 minutes apart and become stronger and more frequent with time; watery or bloody mucus discharge that comes from the vagina; increased pelvic pressure and dull back pain; and leaking of amniotic fluid. This information is not intended to replace advice  given to you by your health care provider. Make sure you discuss any questions you have with your health care provider. Document Released: 03/13/2001 Document Revised: 08/25/2015 Document Reviewed: 05/20/2012 Elsevier Interactive Patient Education  2017 [employer]  

## 2018-02-03 ENCOUNTER — Ambulatory Visit (INDEPENDENT_AMBULATORY_CARE_PROVIDER_SITE_OTHER): Payer: Medicaid Other | Admitting: Advanced Practice Midwife

## 2018-02-03 VITALS — BP 128/88 | HR 98 | Wt 182.4 lb

## 2018-02-03 DIAGNOSIS — L249 Irritant contact dermatitis, unspecified cause: Secondary | ICD-10-CM

## 2018-02-03 DIAGNOSIS — O26893 Other specified pregnancy related conditions, third trimester: Secondary | ICD-10-CM

## 2018-02-03 DIAGNOSIS — R12 Heartburn: Secondary | ICD-10-CM

## 2018-02-03 DIAGNOSIS — Z3403 Encounter for supervision of normal first pregnancy, third trimester: Secondary | ICD-10-CM

## 2018-02-03 MED ORDER — RANITIDINE HCL 150 MG PO TABS: 150.0000 mg | ORAL_TABLET | Freq: Two times a day (BID) | ORAL | 0 refills | Status: DC

## 2018-02-03 NOTE — Progress Notes (Signed)
Pt is here for ROB. G1P0 [redacted]w[redacted]d.

## 2018-02-03 NOTE — Patient Instructions (Signed)
Third Trimester of Pregnancy The third trimester is from week 28 through week 40 (months 7 through 9). The third trimester is a time when the unborn baby (fetus) is growing rapidly. At the end of the ninth month, the fetus is about 20 inches in length and weighs 6-10 pounds. Body changes during your third trimester Your body will continue to go through many changes during pregnancy. The changes vary from woman to woman. During the third trimester:  Your weight will continue to increase. You can expect to gain 25-35 pounds (11-16 kg) by the end of the pregnancy.  You may begin to get stretch marks on your hips, abdomen, and breasts.  You may urinate more often because the fetus is moving lower into your pelvis and pressing on your bladder.  You may develop or continue to have heartburn. This is caused by increased hormones that slow down muscles in the digestive tract.  You may develop or continue to have constipation because increased hormones slow digestion and cause the muscles that push waste through your intestines to relax.  You may develop hemorrhoids. These are swollen veins (varicose veins) in the rectum that can itch or be painful.  You may develop swollen, bulging veins (varicose veins) in your legs.  You may have increased body aches in the pelvis, back, or thighs. This is due to weight gain and increased hormones that are relaxing your joints.  You may have changes in your hair. These can include thickening of your hair, rapid growth, and changes in texture. Some women also have hair loss during or after pregnancy, or hair that feels dry or thin. Your hair will most likely return to normal after your baby is born.  Your breasts will continue to grow and they will continue to become tender. A yellow fluid (colostrum) may leak from your breasts. This is the first milk you are producing for your baby.  Your belly button may stick out.  You may notice more swelling in your hands,  face, or ankles.  You may have increased tingling or numbness in your hands, arms, and legs. The skin on your belly may also feel numb.  You may feel short of breath because of your expanding uterus.  You may have more problems sleeping. This can be caused by the size of your belly, increased need to urinate, and an increase in your body's metabolism.  You may notice the fetus "dropping," or moving lower in your abdomen (lightening).  You may have increased vaginal discharge.  You may notice your joints feel loose and you may have pain around your pelvic bone.  What to expect at prenatal visits You will have prenatal exams every 2 weeks until week 36. Then you will have weekly prenatal exams. During a routine prenatal visit:  You will be weighed to make sure you and the baby are growing normally.  Your blood pressure will be taken.  Your abdomen will be measured to track your baby's growth.  The fetal heartbeat will be listened to.  Any test results from the previous visit will be discussed.  You may have a cervical check near your due date to see if your cervix has softened or thinned (effaced).  You will be tested for Group B streptococcus. This happens between 35 and 37 weeks.  Your health care provider may ask you:  What your birth plan is.  How you are feeling.  If you are feeling the baby move.  If you have had   any abnormal symptoms, such as leaking fluid, bleeding, severe headaches, or abdominal cramping.  If you are using any tobacco products, including cigarettes, chewing tobacco, and electronic cigarettes.  If you have any questions.  Other tests or screenings that may be performed during your third trimester include:  Blood tests that check for low iron levels (anemia).  Fetal testing to check the health, activity level, and growth of the fetus. Testing is done if you have certain medical conditions or if there are problems during the  pregnancy.  Nonstress test (NST). This test checks the health of your baby to make sure there are no signs of problems, such as the baby not getting enough oxygen. During this test, a belt is placed around your belly. The baby is made to move, and its heart rate is monitored during movement.  What is false labor? False labor is a condition in which you feel small, irregular tightenings of the muscles in the womb (contractions) that usually go away with rest, changing position, or drinking water. These are called Braxton Hicks contractions. Contractions may last for hours, days, or even weeks before true labor sets in. If contractions come at regular intervals, become more frequent, increase in intensity, or become painful, you should see your health care provider. What are the signs of labor?  Abdominal cramps.  Regular contractions that start at 10 minutes apart and become stronger and more frequent with time.  Contractions that start on the top of the uterus and spread down to the lower abdomen and back.  Increased pelvic pressure and dull back pain.  A watery or bloody mucus discharge that comes from the vagina.  Leaking of amniotic fluid. This is also known as your "water breaking." It could be a slow trickle or a gush. Let your health care provider know if it has a color or strange odor. If you have any of these signs, call your health care provider right away, even if it is before your due date. Follow these instructions at home: Medicines  Follow your health care provider's instructions regarding medicine use. Specific medicines may be either safe or unsafe to take during pregnancy.  Take a prenatal vitamin that contains at least 600 micrograms (mcg) of folic acid.  If you develop constipation, try taking a stool softener if your health care provider approves. Eating and drinking  Eat a balanced diet that includes fresh fruits and vegetables, whole grains, good sources of protein  such as meat, eggs, or tofu, and low-fat dairy. Your health care provider will help you determine the amount of weight gain that is right for you.  Avoid raw meat and uncooked cheese. These carry germs that can cause birth defects in the baby.  If you have low calcium intake from food, talk to your health care provider about whether you should take a daily calcium supplement.  Eat four or five small meals rather than three large meals a day.  Limit foods that are high in fat and processed sugars, such as fried and sweet foods.  To prevent constipation: ? Drink enough fluid to keep your urine clear or pale yellow. ? Eat foods that are high in fiber, such as fresh fruits and vegetables, whole grains, and beans. Activity  Exercise only as directed by your health care provider. Most women can continue their usual exercise routine during pregnancy. Try to exercise for 30 minutes at least 5 days a week. Stop exercising if you experience uterine contractions.  Avoid heavy   lifting.  Do not exercise in extreme heat or humidity, or at high altitudes.  Wear low-heel, comfortable shoes.  Practice good posture.  You may continue to have sex unless your health care provider tells you otherwise. Relieving pain and discomfort  Take frequent breaks and rest with your legs elevated if you have leg cramps or low back pain.  Take warm sitz baths to soothe any pain or discomfort caused by hemorrhoids. Use hemorrhoid cream if your health care provider approves.  Wear a good support bra to prevent discomfort from breast tenderness.  If you develop varicose veins: ? Wear support pantyhose or compression stockings as told by your healthcare provider. ? Elevate your feet for 15 minutes, 3-4 times a day. Prenatal care  Write down your questions. Take them to your prenatal visits.  Keep all your prenatal visits as told by your health care provider. This is important. Safety  Wear your seat belt at  all times when driving.  Make a list of emergency phone numbers, including numbers for family, friends, the hospital, and police and fire departments. General instructions  Avoid cat litter boxes and soil used by cats. These carry germs that can cause birth defects in the baby. If you have a cat, ask someone to clean the litter box for you.  Do not travel far distances unless it is absolutely necessary and only with the approval of your health care provider.  Do not use hot tubs, steam rooms, or saunas.  Do not drink alcohol.  Do not use any products that contain nicotine or tobacco, such as cigarettes and e-cigarettes. If you need help quitting, ask your health care provider.  Do not use any medicinal herbs or unprescribed drugs. These chemicals affect the formation and growth of the baby.  Do not douche or use tampons or scented sanitary pads.  Do not cross your legs for long periods of time.  To prepare for the arrival of your baby: ? Take prenatal classes to understand, practice, and ask questions about labor and delivery. ? Make a trial run to the hospital. ? Visit the hospital and tour the maternity area. ? Arrange for maternity or paternity leave through employers. ? Arrange for family and friends to take care of pets while you are in the hospital. ? Purchase a rear-facing car seat and make sure you know how to install it in your car. ? Pack your hospital bag. ? Prepare the baby's nursery. Make sure to remove all pillows and stuffed animals from the baby's crib to prevent suffocation.  Visit your dentist if you have not gone during your pregnancy. Use a soft toothbrush to brush your teeth and be gentle when you floss. Contact a health care provider if:  You are unsure if you are in labor or if your water has broken.  You become dizzy.  You have mild pelvic cramps, pelvic pressure, or nagging pain in your abdominal area.  You have lower back pain.  You have persistent  nausea, vomiting, or diarrhea.  You have an unusual or bad smelling vaginal discharge.  You have pain when you urinate. Get help right away if:  Your water breaks before 37 weeks.  You have regular contractions less than 5 minutes apart before 37 weeks.  You have a fever.  You are leaking fluid from your vagina.  You have spotting or bleeding from your vagina.  You have severe abdominal pain or cramping.  You have rapid weight loss or weight gain.    You have shortness of breath with chest pain.  You notice sudden or extreme swelling of your face, hands, ankles, feet, or legs.  Your baby makes fewer than 10 movements in 2 hours.  You have severe headaches that do not go away when you take medicine.  You have vision changes. Summary  The third trimester is from week 28 through week 40, months 7 through 9. The third trimester is a time when the unborn baby (fetus) is growing rapidly.  During the third trimester, your discomfort may increase as you and your baby continue to gain weight. You may have abdominal, leg, and back pain, sleeping problems, and an increased need to urinate.  During the third trimester your breasts will keep growing and they will continue to become tender. A yellow fluid (colostrum) may leak from your breasts. This is the first milk you are producing for your baby.  False labor is a condition in which you feel small, irregular tightenings of the muscles in the womb (contractions) that eventually go away. These are called Braxton Hicks contractions. Contractions may last for hours, days, or even weeks before true labor sets in.  Signs of labor can include: abdominal cramps; regular contractions that start at 10 minutes apart and become stronger and more frequent with time; watery or bloody mucus discharge that comes from the vagina; increased pelvic pressure and dull back pain; and leaking of amniotic fluid. This information is not intended to replace advice  given to you by your health care provider. Make sure you discuss any questions you have with your health care provider. Document Released: 03/13/2001 Document Revised: 08/25/2015 Document Reviewed: 05/20/2012 Elsevier Interactive Patient Education  2017 [employer]  

## 2018-02-03 NOTE — Progress Notes (Addendum)
   PRENATAL VISIT NOTE  Subjective:  Tiffany Ruiz is a 27 y.o. G1P0 at [redacted]w[redacted]d being seen today for ongoing prenatal care.  She is currently monitored for the following issues for this low-risk pregnancy and has Supervision of normal first pregnancy, antepartum; Positive GBS test; and Unilateral choroid plexus cyst of fetus on prenatal ultrasound on their problem list.  Patient reports no complaints.  Contractions: Irritability. Vag. Bleeding: None.  Movement: Present. Denies leaking of fluid.   The following portions of the patient's history were reviewed and updated as appropriate: allergies, current medications, past family history, past medical history, past social history, past surgical history and problem list. Problem list updated.  Objective:   Vitals:   02/03/18 0832  BP: 128/88  Pulse: 98  Weight: 82.7 kg    Fetal Status: Fetal Heart Rate (bpm): 140   Movement: Present     General:  Alert, oriented and cooperative. Patient is in no acute distress.  Skin: Skin is warm and dry. No rash noted.   Cardiovascular: Normal heart rate noted  Respiratory: Normal respiratory effort, no problems with respiration noted  Abdomen: Soft, gravid, appropriate for gestational age.  Pain/Pressure: Absent     Pelvic: Cervical exam deferred        Extremities: Normal range of motion.  Edema: Trace  Mental Status: Normal mood and affect. Normal behavior. Normal judgment and thought content.   Assessment and Plan:  Pregnancy: G1P0 at [redacted]w[redacted]d  1. Encounter for supervision of normal first pregnancy in third trimester --Anticipatory guidance about next visits/weeks of pregnancy given.  2. Irritant contact dermatitis, unspecified trigger --Improved from last visit with switch in detergent.  No rash on abdomen. No itching in areas without rash.  3.  Heartburn in pregnancy --daily heartburn not relieved by Tums --Rx for Zantac 150 mg BID PRN  Preterm labor symptoms and general obstetric  precautions including but not limited to vaginal bleeding, contractions, leaking of fluid and fetal movement were reviewed in detail with the patient. Please refer to After Visit Summary for other counseling recommendations.  Return in about 2 weeks (around 02/17/2018).  Future Appointments  Date Time Provider [place]  02/19/2018  8:45 AM Leftwich-Kirby, Kathie Dike, CNM [facility] None    Fatima Blank, CNM

## 2018-02-03 NOTE — Addendum Note (Signed)
Addended by: Fatima Blank A on: 02/03/2018 09:27 AM   Modules accepted: Orders

## 2018-02-18 ENCOUNTER — Other Ambulatory Visit: Payer: Self-pay

## 2018-02-18 ENCOUNTER — Inpatient Hospital Stay (HOSPITAL_COMMUNITY)
Admission: AD | Admit: 2018-02-18 | Discharge: 2018-02-18 | Disposition: A | Discharge status: Home / Self Care | Payer: Medicaid Other | Source: Ambulatory Visit | Attending: Family Medicine | Admitting: Family Medicine

## 2018-02-18 ENCOUNTER — Encounter (HOSPITAL_COMMUNITY): Payer: Self-pay

## 2018-02-18 ENCOUNTER — Telehealth: Payer: Self-pay | Admitting: *Deleted

## 2018-02-18 DIAGNOSIS — O98819 Other maternal infectious and parasitic diseases complicating pregnancy, unspecified trimester: Secondary | ICD-10-CM

## 2018-02-18 DIAGNOSIS — O479 False labor, unspecified: Secondary | ICD-10-CM

## 2018-02-18 DIAGNOSIS — Z3A34 34 weeks gestation of pregnancy: Secondary | ICD-10-CM | POA: Insufficient documentation

## 2018-02-18 DIAGNOSIS — O98813 Other maternal infectious and parasitic diseases complicating pregnancy, third trimester: Secondary | ICD-10-CM | POA: Insufficient documentation

## 2018-02-18 DIAGNOSIS — O4700 False labor before 37 completed weeks of gestation, unspecified trimester: Secondary | ICD-10-CM

## 2018-02-18 DIAGNOSIS — B3731 Acute candidiasis of vulva and vagina: Secondary | ICD-10-CM

## 2018-02-18 DIAGNOSIS — B373 Candidiasis of vulva and vagina: Secondary | ICD-10-CM | POA: Insufficient documentation

## 2018-02-18 DIAGNOSIS — O4693 Antepartum hemorrhage, unspecified, third trimester: Secondary | ICD-10-CM | POA: Diagnosis present

## 2018-02-18 LAB — WET PREP, GENITAL
Sperm: NONE SEEN
Trich, Wet Prep: NONE SEEN

## 2018-02-18 LAB — URINALYSIS, ROUTINE W REFLEX MICROSCOPIC
Bilirubin Urine: NEGATIVE
Glucose, UA: NEGATIVE mg/dL
Ketones, ur: NEGATIVE mg/dL
Nitrite: NEGATIVE
Protein, ur: NEGATIVE mg/dL
Specific Gravity, Urine: 1.008 (ref 1.005–1.030)
WBC, UA: >50 WBC/hpf — ABNORMAL HIGH (ref 0–5)
pH: 7 (ref 5.0–8.0)

## 2018-02-18 LAB — OB RESULTS CONSOLE GC/CHLAMYDIA: Gonorrhea: NEGATIVE

## 2018-02-18 MED ORDER — TERCONAZOLE 0.4 % VA CREA: 1.0000 | TOPICAL_CREAM | Freq: Every day | VAGINAL | 0 refills | Status: DC

## 2018-02-18 NOTE — Discharge Instructions (Signed)
Braxton Hicks Contractions °Contractions of the uterus can occur throughout pregnancy, but they are not always a sign that you are in labor. You may have practice contractions called Braxton Hicks contractions. These false labor contractions are sometimes confused with true labor. °What are Braxton Hicks contractions? °Braxton Hicks contractions are tightening movements that occur in the muscles of the uterus before labor. Unlike true labor contractions, these contractions do not result in opening (dilation) and thinning of the cervix. Toward the end of pregnancy (32-34 weeks), Braxton Hicks contractions can happen more often and may become stronger. These contractions are sometimes difficult to tell apart from true labor because they can be very uncomfortable. You should not feel embarrassed if you go to the hospital with false labor. °Sometimes, the only way to tell if you are in true labor is for your health care provider to look for changes in the cervix. The health care provider will do a physical exam and may monitor your contractions. If you are not in true labor, the exam should show that your cervix is not dilating and your water has not broken. °If there are other health problems associated with your pregnancy, it is completely safe for you to be sent home with false labor. You may continue to have Braxton Hicks contractions until you go into true labor. °How to tell the difference between true labor and false labor °True labor °· Contractions last 30-70 seconds. °· Contractions become very regular. °· Discomfort is usually felt in the top of the uterus, and it spreads to the lower abdomen and low back. °· Contractions do not go away with walking. °· Contractions usually become more intense and increase in frequency. °· The cervix dilates and gets thinner. °False labor °· Contractions are usually shorter and not as strong as true labor contractions. °· Contractions are usually irregular. °· Contractions  are often felt in the front of the lower abdomen and in the groin. °· Contractions may go away when you walk around or change positions while lying down. °· Contractions get weaker and are shorter-lasting as time goes on. °· The cervix usually does not dilate or become thin. °Follow these instructions at home: °· Take over-the-counter and prescription medicines only as told by your health care provider. °· Keep up with your usual exercises and follow other instructions from your health care provider. °· Eat and drink lightly if you think you are going into labor. °· If Braxton Hicks contractions are making you uncomfortable: °? Change your position from lying down or resting to walking, or change from walking to resting. °? Sit and rest in a tub of warm water. °? Drink enough fluid to keep your urine pale yellow. Dehydration may cause these contractions. °? Do slow and deep breathing several times an hour. °· Keep all follow-up prenatal visits as told by your health care provider. This is important. °Contact a health care provider if: °· You have a fever. °· You have continuous pain in your abdomen. °Get help right away if: °· Your contractions become stronger, more regular, and closer together. °· You have fluid leaking or gushing from your vagina. °· You pass blood-tinged mucus (bloody show). °· You have bleeding from your vagina. °· You have low back pain that you never had before. °· You feel your baby’s head pushing down and causing pelvic pressure. °· Your baby is not moving inside you as much as it used to. °Summary °· Contractions that occur before labor are called Braxton   Hicks contractions, false labor, or practice contractions. °· Braxton Hicks contractions are usually shorter, weaker, farther apart, and less regular than true labor contractions. True labor contractions usually become progressively stronger and regular and they become more frequent. °· Manage discomfort from Braxton Hicks contractions by  changing position, resting in a warm bath, drinking plenty of water, or practicing deep breathing. °This information is not intended to replace advice given to you by your health care provider. Make sure you discuss any questions you have with your health care provider. °Document Released: 08/02/2016 Document Revised: 08/02/2016 Document Reviewed: 08/02/2016 °Elsevier Interactive Patient Education © 2018 [employer] Inc. ° °Safe Medications in Pregnancy  ° °Acne: °Benzoyl Peroxide °Salicylic Acid ° °Backache/Headache: °Tylenol: 2 regular strength every 4 hours OR °             2 Extra strength every 6 hours ° °Colds/Coughs/Allergies: °Benadryl (alcohol free) 25 mg every 6 hours as needed °Breath right strips °Claritin °Cepacol throat lozenges °Chloraseptic throat spray °Cold-Eeze- up to three times per day °Cough drops, alcohol free °Flonase (by prescription only) °Guaifenesin °Mucinex °Robitussin DM (plain only, alcohol free) °Saline nasal spray/drops °Sudafed (pseudoephedrine) & Actifed ** use only after [redacted] weeks gestation and if you do not have high blood pressure °Tylenol °Vicks Vaporub °Zinc lozenges °Zyrtec  ° °Constipation: °Colace °Ducolax suppositories °Fleet enema °Glycerin suppositories °Metamucil °Milk of magnesia °Miralax °Senokot °Smooth move tea ° °Diarrhea: °Kaopectate °Imodium A-D ° °*NO pepto Bismol ° °Hemorrhoids: °Anusol °Anusol HC °Preparation H °Tucks ° °Indigestion: °Tums °Maalox °Mylanta °Zantac  °Pepcid ° °Insomnia: °Benadryl (alcohol free) 25mg every 6 hours as needed °Tylenol PM °Unisom, no Gelcaps ° °Leg Cramps: °Tums °MagGel ° °Nausea/Vomiting:  °Bonine °Dramamine °Emetrol °Ginger extract °Sea bands °Meclizine  °Nausea medication to take during pregnancy:  °Unisom (doxylamine succinate 25 mg tablets) Take one tablet daily at bedtime. If symptoms are not adequately controlled, the dose can be increased to a maximum recommended dose of two tablets daily (1/2 tablet in the morning, 1/2 tablet  mid-afternoon and one at bedtime). °Vitamin B6 100mg tablets. Take one tablet twice a day (up to 200 mg per day). ° °Skin Rashes: °Aveeno products °Benadryl cream or 25mg every 6 hours as needed °Calamine Lotion °1% cortisone cream ° °Yeast infection: °Gyne-lotrimin 7 °Monistat 7 ° ° °**If taking multiple medications, please check labels to avoid duplicating the same active ingredients °**take medication as directed on the label °** Do not exceed 4000 mg of tylenol in 24 hours °**Do not take medications that contain aspirin or ibuprofen ° ° ° ° °

## 2018-02-18 NOTE — MAU Note (Signed)
Urine sent to lab 

## 2018-02-18 NOTE — MAU Note (Signed)
Pt states that she started having abdominal, vagina, and back pain on Sunday.   She started having red/brown discharge that she is unsure if it is blood since 1000. Pt states she is having to wear a panty liner.

## 2018-02-18 NOTE — Telephone Encounter (Signed)
Pt called to office stating she is 34 week and is spotting, would like return call.  Return call to pt. Pt states she has had spotting all through out today. Pt states has been light -dark in color. Pt denies recent intercourse, leaking or ctx.  Pt states she has had some abd pain but doesn't know if ctx. Pt reports +FM.  Pt advise to be seen at [facility] for eval. Advised may be cervical change but needs eval due to bleeding in 3rd trimester.    Pt states understanding and states will be seen.

## 2018-02-18 NOTE — MAU Provider Note (Addendum)
History     CSN: [id number]  Arrival date and time: 02/18/18 1755   First Provider Initiated Contact with Patient 02/18/18 1845      Chief Complaint  Patient presents with  . Vaginal Bleeding  . Contractions   Tiffany Ruiz is a 27 y.o. G1P0 at [redacted]w[redacted]d who presents to MAU with complaints of vaginal bleeding and contractions. She reports increased abdominal pain and contractions since Sunday, describes pain as contractions and pressure that starts in her abdomen and radiates around to her back. Rates pain 5/10. She reports one occurrence of vaginal bleeding this morning around 1000am. She reports reddish brown discharge that occurred today, reports having to wear a panty liner for vaginal bleeding. Denies complications during this pregnancy or recent IC. Denies LOF. +FM.    OB History    Gravida  1   Para      Term      Preterm      AB      Living        SAB      TAB      Ectopic      Multiple      Live Births              Past Medical History:  Diagnosis Date  . Nose fracture     Past Surgical History:  Procedure Laterality Date  . MOUTH SURGERY  2011   wisdom tooth   . NOSE SURGERY      Family History  Problem Relation Age of Onset  . Stroke Maternal Grandfather     Social History   Tobacco Use  . Smoking status: Never Smoker  . Smokeless tobacco: Never Used  Substance Use Topics  . Alcohol use: Not Currently  . Drug use: Never    Allergies: No Known Allergies  Medications Prior to Admission  Medication Sig Dispense Refill Last Dose  . acetaminophen (TYLENOL) 500 MG tablet Take 500 mg by mouth every 6 (six) hours as needed.   Not Taking  . docusate sodium (COLACE) 100 MG capsule Take 1 capsule (100 mg total) by mouth 2 (two) times daily as needed. 30 capsule 2 Taking  . ferrous sulfate (FERROUSUL) 325 (65 FE) MG tablet Take 1 tablet (325 mg total) by mouth daily with breakfast. 30 tablet 5 Taking  . hydrocortisone cream 0.5 % Apply 1  application topically 2 (two) times daily. (Patient not taking: Reported on 02/03/2018) 30 g 0 Not Taking  . nystatin (MYCOSTATIN/NYSTOP) powder Apply topically 3 (three) times daily. (Patient not taking: Reported on 02/03/2018) 15 g 0 Not Taking  . Prenat-FeCbn-FeAspGl-FA-Omega (OB COMPLETE PETITE) 35-5-1-200 MG CAPS Take 1 tablet by mouth daily. 30 capsule 12 Taking  . Prenatal Vit-Fe Fum-FA-Omega (PRENATAL MULTI +DHA) 27-0.8-228 MG CAPS Take 1 capsule by mouth daily. 30 capsule 10 Taking  . ranitidine (ZANTAC) 150 MG tablet Take 1 tablet (150 mg total) by mouth 2 (two) times daily. 60 tablet 0   . sucralfate (CARAFATE) 1 g tablet Take 1 tablet (1 g total) by mouth 4 (four) times daily as needed (stomach pain). (Patient not taking: Reported on 09/19/2017) 30 tablet 0 Not Taking  . terconazole (TERAZOL 3) 0.8 % vaginal cream Place 1 applicator vaginally at bedtime. 20 g 0     Review of Systems  Constitutional: Negative.   Respiratory: Negative.   Cardiovascular: Negative.   Gastrointestinal: Positive for abdominal pain. Negative for constipation, diarrhea, nausea and vomiting.  Genitourinary: Positive for vaginal  bleeding. Negative for difficulty urinating, dysuria, frequency, hematuria and urgency.  Neurological: Negative.    Physical Exam   Blood pressure 126/78, pulse 76, temperature 98.3 F (36.8 C), temperature source Oral, resp. rate 20, weight 84.3 kg, last menstrual period 06/23/2017, SpO2 98 %.  Physical Exam  Nursing note and vitals reviewed. Constitutional: She is oriented to person, place, and time. She appears well-developed and well-nourished. No distress.  Cardiovascular: Normal rate, regular rhythm and normal heart sounds.  Respiratory: Effort normal and breath sounds normal. No respiratory distress. She has no wheezes.  GI: Soft. There is no tenderness.  Gravid appropriate for gestational age, mild- moderate contractions palpated   Genitourinary: No bleeding in the vagina.  Vaginal discharge found.  Genitourinary Comments: Pelvic examination: cervix pink, moderate amount of yellow curdy discharge without odor, no vaginal bleeding present  Neurological: She is alert and oriented to person, place, and time.  Psychiatric: She has a normal mood and affect. Her behavior is normal. Thought content normal.   FHR: 130/moderate/+accels/no decels  Toco: irregular contractions   Dilation: 1 Effacement (%): Thick Station: -3 Presentation: Vertex Exam by:: Wende Bushy CNM  MAU Course  Procedures  MDM Hydration  Recheck cervix in 1 hour  Wet prep and GC/C   Care taken over by Len Blalock CNM @1938  Lajean Manes, CNM 02/18/18, 7:38 PM  Dilation: 1 Effacement (%): Thick Station: -3 Presentation: Vertex Exam by:: Sharolyn Douglas, CNM   No cervical change after 1 hour. No contractions noted on the monitor and patient reports no contractions at this time.   Assessment and Plan   1. Candidiasis of vagina during pregnancy   2. Preterm contractions   3. [redacted] weeks gestation of pregnancy    -Discharge home in stable condition -Rx for terazol sent to patient's pharmacy -Preterm labor precautions discussed -Patient advised to follow-up with [facility] as scheduled for prenatal care -Patient may return to MAU as needed or if her condition were to change or worsen  Wende Mott, CNM 02/18/18 8:00 PM

## 2018-02-19 ENCOUNTER — Encounter: Payer: Medicaid Other | Admitting: Advanced Practice Midwife

## 2018-02-19 LAB — GC/CHLAMYDIA PROBE AMP (~~LOC~~) NOT AT ARMC
Chlamydia: NEGATIVE
Neisseria Gonorrhea: NEGATIVE

## 2018-02-24 ENCOUNTER — Ambulatory Visit (INDEPENDENT_AMBULATORY_CARE_PROVIDER_SITE_OTHER): Payer: Medicaid Other | Admitting: Advanced Practice Midwife

## 2018-02-24 VITALS — BP 136/88 | HR 98 | Wt 184.4 lb

## 2018-02-24 DIAGNOSIS — Z23 Encounter for immunization: Secondary | ICD-10-CM

## 2018-02-24 DIAGNOSIS — M549 Dorsalgia, unspecified: Secondary | ICD-10-CM

## 2018-02-24 DIAGNOSIS — Z3403 Encounter for supervision of normal first pregnancy, third trimester: Secondary | ICD-10-CM

## 2018-02-24 DIAGNOSIS — O9989 Other specified diseases and conditions complicating pregnancy, childbirth and the puerperium: Secondary | ICD-10-CM

## 2018-02-24 MED ORDER — CYCLOBENZAPRINE HCL 10 MG PO TABS: 5.0000 mg | ORAL_TABLET | Freq: Three times a day (TID) | ORAL | 0 refills | Status: DC | PRN

## 2018-02-24 NOTE — Patient Instructions (Signed)
Tdap Vaccine (Tetanus, Diphtheria and Pertussis): What You Need to Know 1. Why get vaccinated? Tetanus, diphtheria and pertussis are very serious diseases. Tdap vaccine can protect us from these diseases. And, Tdap vaccine given to pregnant women can protect newborn babies against pertussis. TETANUS (Lockjaw) is rare in the [place] today. It causes painful muscle tightening and stiffness, usually all over the body.  It can lead to tightening of muscles in the head and neck so you can't open your mouth, swallow, or sometimes even breathe. Tetanus kills about 1 out of 10 people who are infected even after receiving the best medical care.  DIPHTHERIA is also rare in the [place] today. It can cause a thick coating to form in the back of the throat.  It can lead to breathing problems, heart failure, paralysis, and death.  PERTUSSIS (Whooping Cough) causes severe coughing spells, which can cause difficulty breathing, vomiting and disturbed sleep.  It can also lead to weight loss, incontinence, and rib fractures. Up to 2 in 100 adolescents and 5 in 100 adults with pertussis are hospitalized or have complications, which could include pneumonia or death.  These diseases are caused by bacteria. Diphtheria and pertussis are spread from person to person through secretions from coughing or sneezing. Tetanus enters the body through cuts, scratches, or wounds. Before vaccines, as many as 200,000 cases of diphtheria, 200,000 cases of pertussis, and hundreds of cases of tetanus, were reported in the [place] each year. Since vaccination began, reports of cases for tetanus and diphtheria have dropped by about 99% and for pertussis by about 80%. 2. Tdap vaccine Tdap vaccine can protect adolescents and adults from tetanus, diphtheria, and pertussis. One dose of Tdap is routinely given at age 11 or 12. People who did not get Tdap at that age should get it as soon as possible. Tdap is especially  important for healthcare professionals and anyone having close contact with a baby younger than 26 months. Pregnant women should get a dose of Tdap during every pregnancy, to protect the newborn from pertussis. Infants are most at risk for severe, life-threatening complications from pertussis. Another vaccine, called Td, protects against tetanus and diphtheria, but not pertussis. A Td booster should be given every 10 years. Tdap may be given as one of these boosters if you have never gotten Tdap before. Tdap may also be given after a severe cut or burn to prevent tetanus infection. Your doctor or the person giving you the vaccine can give you more information. Tdap may safely be given at the same time as other vaccines. 3. Some people should not get this vaccine  A person who has ever had a life-threatening allergic reaction after a previous dose of any diphtheria, tetanus or pertussis containing vaccine, OR has a severe allergy to any part of this vaccine, should not get Tdap vaccine. Tell the person giving the vaccine about any severe allergies.  Anyone who had coma or long repeated seizures within 7 days after a childhood dose of DTP or DTaP, or a previous dose of Tdap, should not get Tdap, unless a cause other than the vaccine was found. They can still get Td.  Talk to your doctor if you: ? have seizures or another nervous system problem, ? had severe pain or swelling after any vaccine containing diphtheria, tetanus or pertussis, ? ever had a condition called Guillain-Barr Syndrome (GBS), ? aren't feeling well on the day the shot is scheduled. 4. Risks With any medicine, including   vaccines, there is a chance of side effects. These are usually mild and go away on their own. Serious reactions are also possible but are rare. Most people who get Tdap vaccine do not have any problems with it. Mild problems following Tdap: (Did not interfere with activities)  Pain where the shot was given (about  3 in 4 adolescents or 2 in 3 adults)  Redness or swelling where the shot was given (about 1 person in 5)  Mild fever of at least 100.4F (up to about 1 in 25 adolescents or 26 in 100 adults)  Headache (about 3 or 4 people in 10)  Tiredness (about 1 person in 3 or 4)  Nausea, vomiting, diarrhea, stomach ache (up to 1 in 4 adolescents or 1 in 10 adults)  Chills, sore joints (about 1 person in 10)  Body aches (about 1 person in 3 or 4)  Rash, swollen glands (uncommon)  Moderate problems following Tdap: (Interfered with activities, but did not require medical attention)  Pain where the shot was given (up to 1 in 5 or 6)  Redness or swelling where the shot was given (up to about 1 in 16 adolescents or 1 in 12 adults)  Fever over 102F (about 26 in 100 adolescents or 1 in 250 adults)  Headache (about 1 in 7 adolescents or 1 in 10 adults)  Nausea, vomiting, diarrhea, stomach ache (up to 1 or 3 people in 100)  Swelling of the entire arm where the shot was given (up to about 1 in 500).  Severe problems following Tdap: (Unable to perform usual activities; required medical attention)  Swelling, severe pain, bleeding and redness in the arm where the shot was given (rare).  Problems that could happen after any vaccine:  People sometimes faint after a medical procedure, including vaccination. Sitting or lying down for about 15 minutes can help prevent fainting, and injuries caused by a fall. Tell your doctor if you feel dizzy, or have vision changes or ringing in the ears.  Some people get severe pain in the shoulder and have difficulty moving the arm where a shot was given. This happens very rarely.  Any medication can cause a severe allergic reaction. Such reactions from a vaccine are very rare, estimated at fewer than 1 in a million doses, and would happen within a few minutes to a few hours after the vaccination. As with any medicine, there is a very remote chance of a vaccine  causing a serious injury or death. The safety of vaccines is always being monitored. For more information, visit: www.cdc.gov/vaccinesafety/ 5. What if there is a serious problem? What should I look for? Look for anything that concerns you, such as signs of a severe allergic reaction, very high fever, or unusual behavior. Signs of a severe allergic reaction can include hives, swelling of the face and throat, difficulty breathing, a fast heartbeat, dizziness, and weakness. These would usually start a few minutes to a few hours after the vaccination. What should I do?  If you think it is a severe allergic reaction or other emergency that can't wait, call 9-1-1 or get the person to the nearest hospital. Otherwise, call your doctor.  Afterward, the reaction should be reported to the Vaccine Adverse Event Reporting System (VAERS). Your doctor might file this report, or you can do it yourself through the VAERS web site at www.vaers.hhs.gov, or by calling 1-800-822-7967. ? VAERS does not give medical advice. 6. The National Vaccine Injury Compensation Program The National   Vaccine Injury Compensation Program (VICP) is a federal program that was created to compensate people who may have been injured by certain vaccines. Persons who believe they may have been injured by a vaccine can learn about the program and about filing a claim by calling 1-800-338-2382 or visiting the VICP website at www.hrsa.gov/vaccinecompensation. There is a time limit to file a claim for compensation. 7. How can I learn more?  Ask your doctor. He or she can give you the vaccine package insert or suggest other sources of information.  Call your local or state health department.  Contact the Centers for Disease Control and Prevention (CDC): ? Call 1-800-232-4636 (1-800-CDC-INFO) or ? Visit CDC's website at www.cdc.gov/vaccines CDC Tdap Vaccine VIS (05/26/13) This information is not intended to replace advice given to you by your  health care provider. Make sure you discuss any questions you have with your health care provider. Document Released: 09/18/2011 Document Revised: 12/08/2015 Document Reviewed: 12/08/2015 Elsevier Interactive Patient Education  2017 Elsevier Inc.  

## 2018-02-24 NOTE — Progress Notes (Signed)
   PRENATAL VISIT NOTE  Subjective:  Tiffany Ruiz is a 27 y.o. G1P0 at [redacted]w[redacted]d being seen today for ongoing prenatal care.  She is currently monitored for the following issues for this low-risk pregnancy and has Supervision of normal first pregnancy, antepartum; Positive GBS test; and Unilateral choroid plexus cyst of fetus on prenatal ultrasound on their problem list.  Patient reports backache.  Contractions: Irritability. Vag. Bleeding: None.  Movement: Present. Denies leaking of fluid.   The following portions of the patient's history were reviewed and updated as appropriate: allergies, current medications, past family history, past medical history, past social history, past surgical history and problem list. Problem list updated.  Objective:   Vitals:   02/24/18 1552  BP: 136/88  Pulse: 98  Weight: 83.6 kg    Fetal Status: Fetal Heart Rate (bpm): 134 Fundal Height: 35 cm Movement: Present     General:  Alert, oriented and cooperative. Patient is in no acute distress.  Skin: Skin is warm and dry. No rash noted.   Cardiovascular: Normal heart rate noted  Respiratory: Normal respiratory effort, no problems with respiration noted  Abdomen: Soft, gravid, appropriate for gestational age.  Pain/Pressure: Present     Pelvic: Cervical exam deferred        Extremities: Normal range of motion.  Edema: None  Mental Status: Normal mood and affect. Normal behavior. Normal judgment and thought content.   Assessment and Plan:  Pregnancy: G1P0 at [redacted]w[redacted]d  1. Encounter for supervision of normal first pregnancy in third trimester --Anticipatory guidance about next visits/weeks of pregnancy given. - Tdap vaccine greater than or equal to 7yo IM  2. Back pain affecting pregnancy in third trimester --Pt using heat, rest, pillows, Tylenol. Is wearing support belt at work but not on days off.  Pain is mostly at night, constant x 2-3 hours when she goes to bed.   --Wear maternity belt daily.  Continue heat/ice PRN. - cyclobenzaprine (FLEXERIL) 10 MG tablet; Take 0.5-1 tablets (5-10 mg total) by mouth 3 (three) times daily as needed for muscle spasms.  Dispense: 20 tablet; Refill: 0  Preterm labor symptoms and general obstetric precautions including but not limited to vaginal bleeding, contractions, leaking of fluid and fetal movement were reviewed in detail with the patient. Please refer to After Visit Summary for other counseling recommendations.  Return in about 2 weeks (around 03/10/2018).  Future Appointments  Date Time Provider Shaniko  03/12/2018  9:45 AM Lajean Manes, CNM [facility] None    Fatima Blank, CNM

## 2018-03-05 LAB — OB RESULTS CONSOLE GBS: GBS: POSITIVE

## 2018-03-12 ENCOUNTER — Encounter: Payer: Self-pay | Admitting: Certified Nurse Midwife

## 2018-03-12 ENCOUNTER — Inpatient Hospital Stay (HOSPITAL_COMMUNITY)
Admission: AD | Admit: 2018-03-12 | Discharge: 2018-03-15 | DRG: 807 | Disposition: A | Discharge status: Home / Self Care | Payer: Medicaid Other | Attending: Obstetrics and Gynecology | Admitting: Obstetrics and Gynecology

## 2018-03-12 ENCOUNTER — Ambulatory Visit (INDEPENDENT_AMBULATORY_CARE_PROVIDER_SITE_OTHER): Payer: Medicaid Other | Admitting: Certified Nurse Midwife

## 2018-03-12 ENCOUNTER — Encounter: Payer: Medicaid Other | Admitting: Obstetrics & Gynecology

## 2018-03-12 ENCOUNTER — Other Ambulatory Visit: Payer: Self-pay

## 2018-03-12 ENCOUNTER — Encounter (HOSPITAL_COMMUNITY): Payer: Self-pay | Admitting: *Deleted

## 2018-03-12 VITALS — BP 147/95 | HR 90 | Wt 187.5 lb

## 2018-03-12 DIAGNOSIS — O358XX0 Maternal care for other (suspected) fetal abnormality and damage, not applicable or unspecified: Secondary | ICD-10-CM | POA: Diagnosis present

## 2018-03-12 DIAGNOSIS — O350XX0 Maternal care for (suspected) central nervous system malformation in fetus, not applicable or unspecified: Secondary | ICD-10-CM | POA: Diagnosis present

## 2018-03-12 DIAGNOSIS — O4202 Full-term premature rupture of membranes, onset of labor within 24 hours of rupture: Secondary | ICD-10-CM | POA: Diagnosis not present

## 2018-03-12 DIAGNOSIS — O134 Gestational [pregnancy-induced] hypertension without significant proteinuria, complicating childbirth: Principal | ICD-10-CM | POA: Diagnosis present

## 2018-03-12 DIAGNOSIS — O99824 Streptococcus B carrier state complicating childbirth: Secondary | ICD-10-CM | POA: Diagnosis present

## 2018-03-12 DIAGNOSIS — O133 Gestational [pregnancy-induced] hypertension without significant proteinuria, third trimester: Secondary | ICD-10-CM

## 2018-03-12 DIAGNOSIS — O139 Gestational [pregnancy-induced] hypertension without significant proteinuria, unspecified trimester: Secondary | ICD-10-CM

## 2018-03-12 DIAGNOSIS — Z3A37 37 weeks gestation of pregnancy: Secondary | ICD-10-CM

## 2018-03-12 DIAGNOSIS — B951 Streptococcus, group B, as the cause of diseases classified elsewhere: Secondary | ICD-10-CM | POA: Diagnosis present

## 2018-03-12 DIAGNOSIS — Z3400 Encounter for supervision of normal first pregnancy, unspecified trimester: Secondary | ICD-10-CM

## 2018-03-12 DIAGNOSIS — IMO0002 Reserved for concepts with insufficient information to code with codable children: Secondary | ICD-10-CM | POA: Diagnosis present

## 2018-03-12 HISTORY — DX: Encounter for supervision of normal first pregnancy, unspecified trimester: Z34.00

## 2018-03-12 HISTORY — DX: Gestational (pregnancy-induced) hypertension without significant proteinuria, unspecified trimester: O13.9

## 2018-03-12 HISTORY — DX: Maternal care for (suspected) central nervous system malformation in fetus, not applicable or unspecified: O35.0XX0

## 2018-03-12 HISTORY — DX: Essential (primary) hypertension: I10

## 2018-03-12 LAB — COMPREHENSIVE METABOLIC PANEL
ALBUMIN: 2.7 g/dL — AB (ref 3.5–5.0)
ALT: 12 U/L (ref 0–44)
AST: 16 U/L (ref 15–41)
Alkaline Phosphatase: 104 U/L (ref 38–126)
Anion gap: 8 (ref 5–15)
BUN: <5 mg/dL — ABNORMAL LOW (ref 6–20)
CHLORIDE: 104 mmol/L (ref 98–111)
CO2: 23 mmol/L (ref 22–32)
Calcium: 8.2 mg/dL — ABNORMAL LOW (ref 8.9–10.3)
Creatinine, Ser: 0.69 mg/dL (ref 0.44–1.00)
GFR calc non Af Amer: >60 mL/min (ref >60)
Glucose, Bld: 109 mg/dL — ABNORMAL HIGH (ref 70–99)
POTASSIUM: 3.7 mmol/L (ref 3.5–5.1)
Sodium: 135 mmol/L (ref 135–145)
Total Bilirubin: 0.3 mg/dL (ref 0.3–1.2)
Total Protein: 5.6 g/dL — ABNORMAL LOW (ref 6.5–8.1)

## 2018-03-12 LAB — CBC
HCT: 35.8 % — ABNORMAL LOW (ref 36.0–46.0)
Hemoglobin: 11.5 g/dL — ABNORMAL LOW (ref 12.0–15.0)
MCH: 28.5 pg (ref 26.0–34.0)
MCHC: 32.1 g/dL (ref 30.0–36.0)
MCV: 88.8 fL (ref 80.0–100.0)
NRBC: 0 % (ref 0.0–0.2)
Platelets: 148 10*3/uL — ABNORMAL LOW (ref 150–400)
RBC: 4.03 MIL/uL (ref 3.87–5.11)
RDW: 14.6 % (ref 11.5–15.5)
WBC: 7.9 10*3/uL (ref 4.0–10.5)

## 2018-03-12 LAB — PROTEIN / CREATININE RATIO, URINE
CREATININE, URINE: 44 mg/dL
Protein Creatinine Ratio: 0.3 mg/mg{Cre} — ABNORMAL HIGH (ref 0.00–0.15)
Total Protein, Urine: 13 mg/dL

## 2018-03-12 LAB — TYPE AND SCREEN
ABO/RH(D): O POS
Antibody Screen: NEGATIVE

## 2018-03-12 LAB — ABO/RH: ABO/RH(D): O POS

## 2018-03-12 MED ORDER — ONDANSETRON HCL 4 MG/2ML IJ SOLN: 4.0000 mg | Freq: Four times a day (QID) | INTRAMUSCULAR | Status: DC | PRN

## 2018-03-12 MED ORDER — OXYTOCIN 40 UNITS IN LACTATED RINGERS INFUSION - SIMPLE MED
2.5000 [IU]/h | INTRAVENOUS | Status: DC
  Filled 2018-03-12: qty 1000

## 2018-03-12 MED ORDER — LACTATED RINGERS IV SOLN: 500.0000 mL | INTRAVENOUS | Status: DC | PRN

## 2018-03-12 MED ORDER — SOD CITRATE-CITRIC ACID 500-334 MG/5ML PO SOLN: 30.0000 mL | ORAL | Status: DC | PRN

## 2018-03-12 MED ORDER — FLEET ENEMA 7-19 GM/118ML RE ENEM: 1.0000 | ENEMA | RECTAL | Status: DC | PRN

## 2018-03-12 MED ORDER — FENTANYL CITRATE (PF) 100 MCG/2ML IJ SOLN
100.0000 ug | INTRAMUSCULAR | Status: DC | PRN
  Administered 2018-03-12 – 2018-03-13 (×6): 100 ug via INTRAVENOUS
  Filled 2018-03-12 (×6): qty 2

## 2018-03-12 MED ORDER — TERBUTALINE SULFATE 1 MG/ML IJ SOLN
0.2500 mg | Freq: Once | INTRAMUSCULAR | Status: DC | PRN
  Filled 2018-03-12: qty 1

## 2018-03-12 MED ORDER — LIDOCAINE HCL (PF) 1 % IJ SOLN
30.0000 mL | INTRAMUSCULAR | Status: DC | PRN
  Filled 2018-03-12: qty 30

## 2018-03-12 MED ORDER — PENICILLIN G 3 MILLION UNITS IVPB - SIMPLE MED
3.0000 10*6.[IU] | INTRAVENOUS | Status: DC
  Administered 2018-03-12 – 2018-03-13 (×4): 3 10*6.[IU] via INTRAVENOUS
  Filled 2018-03-12 (×9): qty 100

## 2018-03-12 MED ORDER — SODIUM CHLORIDE 0.9 % IV SOLN
5.0000 10*6.[IU] | Freq: Once | INTRAVENOUS | Status: AC
  Administered 2018-03-12: 5 10*6.[IU] via INTRAVENOUS
  Filled 2018-03-12: qty 5

## 2018-03-12 MED ORDER — OXYTOCIN BOLUS FROM INFUSION: 500.0000 mL | Freq: Once | INTRAVENOUS | Status: DC

## 2018-03-12 MED ORDER — LACTATED RINGERS IV SOLN
INTRAVENOUS | Status: DC
  Administered 2018-03-12 – 2018-03-13 (×2): via INTRAVENOUS

## 2018-03-12 MED ORDER — MISOPROSTOL 25 MCG QUARTER TABLET
25.0000 ug | ORAL_TABLET | ORAL | Status: DC | PRN
  Administered 2018-03-12 (×3): 25 ug via VAGINAL
  Filled 2018-03-12 (×5): qty 1

## 2018-03-12 NOTE — Plan of Care (Signed)
  Problem: Education: Goal: Knowledge of Childbirth will improve Outcome: Progressing Goal: Ability to make informed decisions regarding treatment and plan of care will improve Outcome: Progressing Goal: Ability to state and carry out methods to decrease the pain will improve Outcome: Progressing   Problem: Coping: Goal: Ability to verbalize concerns and feelings about labor and delivery will improve Outcome: Progressing   Problem: Life Cycle: Goal: Ability to make normal progression through stages of labor will improve Outcome: Progressing Goal: Ability to effectively push during vaginal delivery will improve Outcome: Progressing   Problem: Role Relationship: Goal: Ability to demonstrate positive interaction with the child will improve Outcome: Progressing   Problem: Safety: Goal: Risk of complications during labor and delivery will decrease Outcome: Progressing   Problem: Pain Management: Goal: Relief or control of pain from uterine contractions will improve Outcome: Progressing   Problem: Education: Goal: Knowledge of General Education information will improve Description Including pain rating scale, medication(s)/side effects and non-pharmacologic comfort measures Outcome: Progressing   Problem: Health Behavior/Discharge Planning: Goal: Ability to manage health-related needs will improve Outcome: Progressing   Problem: Clinical Measurements: Goal: Ability to maintain clinical measurements within normal limits will improve Outcome: Progressing Goal: Will remain free from infection Outcome: Progressing Goal: Diagnostic test results will improve Outcome: Progressing Goal: Respiratory complications will improve Outcome: Progressing Goal: Cardiovascular complication will be avoided Outcome: Progressing   Problem: Activity: Goal: Risk for activity intolerance will decrease Outcome: Progressing   Problem: Nutrition: Goal: Adequate nutrition will be  maintained Outcome: Progressing   Problem: Coping: Goal: Level of anxiety will decrease Outcome: Progressing   Problem: Elimination: Goal: Will not experience complications related to bowel motility Outcome: Progressing Goal: Will not experience complications related to urinary retention Outcome: Progressing   Problem: Pain Managment: Goal: General experience of comfort will improve Outcome: Progressing   Problem: Safety: Goal: Ability to remain free from injury will improve Outcome: Progressing   Problem: Skin Integrity: Goal: Risk for impaired skin integrity will decrease Outcome: Progressing

## 2018-03-12 NOTE — Progress Notes (Signed)
   PRENATAL VISIT NOTE  Subjective:  Tiffany Ruiz is a 27 y.o. G1P0 at [redacted]w[redacted]d being seen today for ongoing prenatal care.  She is currently monitored for the following issues for this low-risk pregnancy and has Supervision of normal first pregnancy, antepartum; Positive GBS test; and Unilateral choroid plexus cyst of fetus on prenatal ultrasound on their problem list.  Patient reports headache and dizziness.  Contractions: Irregular. Vag. Bleeding: None.  Movement: Present. Denies leaking of fluid.   The following portions of the patient's history were reviewed and updated as appropriate: allergies, current medications, past family history, past medical history, past social history, past surgical history and problem list. Problem list updated.  Objective:   Vitals:   03/12/18 0950 03/12/18 0957  BP: (!) 147/94 (!) 147/95  Pulse: 90   Weight: 187 lb 8 oz (85 kg)     Fetal Status: Fetal Heart Rate (bpm): 142   Movement: Present  Presentation: Vertex  General:  Alert, oriented and cooperative. Patient is in no acute distress.  Skin: Skin is warm and dry. No rash noted.   Cardiovascular: Normal heart rate noted  Respiratory: Normal respiratory effort, no problems with respiration noted  Abdomen: Soft, gravid, appropriate for gestational age.  Pain/Pressure: Present     Pelvic: Cervical exam performed Dilation: 1 Effacement (%): Thick Station: -3  Extremities: Normal range of motion.  Edema: Trace  Mental Status: Normal mood and affect. Normal behavior. Normal judgment and thought content.   Assessment and Plan:  Pregnancy: G1P0 at [redacted]w[redacted]d  1. Supervision of normal first pregnancy, antepartum - Patient reports occasional HA, dizziness and RUQ pain occurring for the past week. Most recent episode being yesterday, she denies taking any medication for HA, denies currently having a HA. Reports on Sunday had RUQ abdominal pain throughout the night where she couldn't sleep.  - Did not call  the office or be seen in [facility] for symptoms  2. Gestational hypertension, third trimester - BP in office today 147/94 and 147/95, she denies HA, vision changes, or epigastric pain at this time  - Elevated BP of 135/91 at [redacted]w[redacted]d   - Diagnosed with gestational hypertension today in office, direct admit to L&D for IOL and PEC labs - Labor and delivery team notified of admission, GBS treatment in labor   Term labor symptoms and general obstetric precautions including but not limited to vaginal bleeding, contractions, leaking of fluid and fetal movement were reviewed in detail with the patient. Please refer to After Visit Summary for other counseling recommendations.  Return in about 5 weeks (around 04/16/2018) for POSTPARTUM.  Lajean Manes, CNM

## 2018-03-12 NOTE — H&P (Signed)
LABOR AND DELIVERY ADMISSION HISTORY AND PHYSICAL NOTE  Tiffany Ruiz is a 27 y.o. female G1P0000 with IUP at [redacted]w[redacted]d presenting for IOL for new diagnosis of gestational HTN after found to have elevated BP, occasional HA, dizziness, and RUQ pain for approximately 1 week. Denies current HA or abdominal pain.   She reports positive fetal movement. She denies leakage of fluid or vaginal bleeding.  Prenatal History/Complications: PNC at [facility] Pregnancy complications:  - Positive GBS test - Unilateral choroid plexus cyst of fetus on prenatal ultrasound, resolved on 12/09/17 ultrasound  - Elevated BP of 135/91 at [redacted]w[redacted]d - BP 147/94 and 147/95 today (03/12/18)  Past Medical History: Past Medical History:  Diagnosis Date  . Hypertension   . Nose fracture     Past Surgical History: Past Surgical History:  Procedure Laterality Date  . MOUTH SURGERY  2011   wisdom tooth   . NOSE SURGERY      Obstetrical History: OB History    Gravida  1   Para  0   Term  0   Preterm  0   AB  0   Living  0     SAB  0   TAB  0   Ectopic  0   Multiple  0   Live Births  0           Social History: Social History   Socioeconomic History  . Marital status: Single    Spouse name: Not on file  . Number of children: Not on file  . Years of education: Not on file  . Highest education level: Not on file  Occupational History  . Not on file  Social Needs  . Financial resource strain: Not hard at all  . Food insecurity:    Worry: Never true    Inability: Never true  . Transportation needs:    Medical: No    Non-medical: No  Tobacco Use  . Smoking status: Never Smoker  . Smokeless tobacco: Never Used  Substance and Sexual Activity  . Alcohol use: Not Currently  . Drug use: Never  . Sexual activity: Not Currently  Lifestyle  . Physical activity:    Days per week: Not on file    Minutes per session: Not on file  . Stress: Not at all  Relationships  . Social  connections:    Talks on phone: Not on file    Gets together: Not on file    Attends religious service: Not on file    Active member of club or organization: Not on file    Attends meetings of clubs or organizations: Not on file    Relationship status: Not on file  Other Topics Concern  . Not on file  Social History Narrative  . Not on file    Family History: Family History  Problem Relation Age of Onset  . Stroke Maternal Grandfather     Allergies: No Known Allergies  Medications Prior to Admission  Medication Sig Dispense Refill Last Dose  . acetaminophen (TYLENOL) 500 MG tablet Take 500 mg by mouth every 6 (six) hours as needed.   Taking  . cyclobenzaprine (FLEXERIL) 10 MG tablet Take 0.5-1 tablets (5-10 mg total) by mouth 3 (three) times daily as needed for muscle spasms. 20 tablet 0 Taking  . docusate sodium (COLACE) 100 MG capsule Take 1 capsule (100 mg total) by mouth 2 (two) times daily as needed. 30 capsule 2 Taking  . ferrous sulfate (FERROUSUL) 325 (  65 FE) MG tablet Take 1 tablet (325 mg total) by mouth daily with breakfast. 30 tablet 5 Taking  . hydrocortisone cream 0.5 % Apply 1 application topically 2 (two) times daily. (Patient not taking: Reported on 03/12/2018) 30 g 0 Not Taking  . PRENATAL 27-1 MG TABS Take 1 tablet by mouth daily.  10 Taking  . ranitidine (ZANTAC) 150 MG tablet Take 1 tablet (150 mg total) by mouth 2 (two) times daily. 60 tablet 0 Taking  . terconazole (TERAZOL 7) 0.4 % vaginal cream Place 1 applicator vaginally at bedtime. 45 g 0 Taking     Review of Systems  All systems reviewed and negative except as stated in HPI  Physical Exam Blood pressure 128/84, pulse (!) 120, temperature 98.4 F (36.9 C), temperature source Oral, resp. rate 20, height 5\' 2"  (1.575 m), weight 85 kg, last menstrual period 06/23/2017. General appearance: alert, oriented, NAD Lungs: normal respiratory effort Heart: regular rate Abdomen: soft, non-tender; gravid, FH  appropriate for GA Extremities: No calf swelling or tenderness Presentation: Vertex Fetal monitoring: Category 1, HR 145, 15x15 accelerations  Uterine activity: Occasional contractions, 1cm dilation, Thick effacement, -3 station     Prenatal labs: ABO, Rh: O/Positive/-- (06/20 1650) Antibody: Negative (06/20 1650) Rubella: 1.17 (06/20 1650) RPR: Non Reactive (10/07 1030)  HBsAg: Negative (06/20 1650)  HIV: Non Reactive (10/07 1030)  GC/Chlamydia: Neg 02/18/18 GBS: Positive (12/04 0000)  2-hr GTT: 92 Genetic screening:  Normal Anatomy US: 12/09/17: Normal fetal growth, no fetal abnormalities, choroid plexus cyst resolved  Prenatal Transfer Tool  Maternal Diabetes: No Genetic Screening: Normal Maternal Ultrasounds/Referrals: Normal Fetal Ultrasounds or other Referrals:  None Maternal Substance Abuse:  No Significant Maternal Medications:  Meds include: Zantac Other: Prenatal vitamin, Miralax Significant Maternal Lab Results: None  No results found for this or any previous visit (from the past 24 hour(s)).  Patient Active Problem List   Diagnosis Date Noted  . Gestational hypertension 03/12/2018  . Indication for care in labor or delivery 03/12/2018  . Unilateral choroid plexus cyst of fetus on prenatal ultrasound 11/11/2017  . Positive GBS test 09/24/2017  . Supervision of normal first pregnancy, antepartum 09/19/2017    Assessment: Tiffany Ruiz is a 27 y.o. G1P0000 at [redacted]w[redacted]d here for IOL for gestational hypertension after found to have elevated BP, occasional HA, dizziness, and RUQ pain for approximately 1 week.   #Labor: Occasional contraction, 1cm dilation, Thick effacement, -3 station, Cytotec 25 mcg q4h  #Gestational Hypertension: New diagnosis. CMP wnl, platelets borderline low at 148. Will consider rechecking if worsening symptoms, BP, or > 24 hours.  #Pain: Plan for possible epidural #FWB: Category 1, HR 145, 15x15 #ID: GBS Positive, Penicillin  #MOF:  Breast #MOC: Condoms #Circ: N/A, girl  Tiffany Ruiz MS3 03/12/2018, 2:41 PM   Attestation: I have seen this patient and agree with the medical student's documentation, and we have discussed the plan of care. Plan for FB at next check. Blood pressures have been normal since arrival, currently asymptomatic.   Lambert Mody. Juleen China, DO OB/GYN Fellow

## 2018-03-12 NOTE — Progress Notes (Signed)
Pt is here for ROB. G1P0 [redacted]w[redacted]d. Pt has c/o headaches that come and go for about a week as well as some dizziness and abd. Pain.

## 2018-03-12 NOTE — Progress Notes (Signed)
Patient ID: Tiffany Ruiz, female   DOB: [birth year], 27 y.o.   MRN: [id number] Sleeping at intervals  Vitals:   03/12/18 2131 03/12/18 2201 03/12/18 2306 03/12/18 2331  BP: (!) 99/48 (!) 96/47 130/77 (!) 114/56  Pulse: 65 72 74 67  Resp: 17 18 18 17   Temp:   98.8 F (37.1 C)   TempSrc:   Oral   Weight:      Height:       FHR 140s with good variability UCs irregular  Dilation: (foley bulb in place) Effacement (%): Thick Cervical Position: Posterior Station: -2 Presentation: Vertex Exam by:: Jerrell Mylar, RN  Had Cytotec at 2303 WIll continue to observe

## 2018-03-12 NOTE — Anesthesia Pain Management Evaluation Note (Signed)
  CRNA Pain Management Visit Note  Patient: Tiffany Ruiz, 27 y.o., female  "Hello I am a member of the anesthesia team at [facility]. We have an anesthesia team available at all times to provide care throughout the hospital, including epidural management and anesthesia for C-section. I don't know your plan for the delivery whether it a natural birth, water birth, IV sedation, nitrous supplementation, doula or epidural, but we want to meet your pain goals."   1.Was your pain managed to your expectations on prior hospitalizations?   No prior hospitalizations  2.What is your expectation for pain management during this hospitalization?     Epidural  3.How can we help you reach that goal?   Record the patient's initial score and the patient's pain goal.   Pain: 0  Pain Goal: 8 [facility] wants you to be able to say your pain was always managed very well.  Jabier Mutton 03/12/2018

## 2018-03-12 NOTE — Progress Notes (Signed)
LABOR PROGRESS NOTE  Tiffany Ruiz is a 27 y.o. G1P0000 at [redacted]w[redacted]d  admitted for IOL for gHTN  Subjective: Patient states that she is feeling well overall. She is not having strong contractions at this time. She does have some lower abdominal cramping.  Objective: BP 129/67   Pulse 63   Temp 98.4 F (36.9 C) (Oral)   Resp 18   Ht 5\' 2"  (1.575 m)   Wt 85 kg   LMP 06/23/2017   BMI 34.29 kg/m  or  Vitals:   03/12/18 1427 03/12/18 1445 03/12/18 1600 03/12/18 1850  BP:  123/86 128/87 129/67  Pulse:  100 98 63  Resp:  20 18 18   Temp:      TempSrc:      Weight: 85 kg     Height: 5\' 2"  (1.575 m)       ~1830 Dilation: 1.5 Effacement (%): Thick Cervical Position: Posterior Station: -2 Presentation: Vertex Exam by:: Dr. Ouida Sills FHT: baseline rate 145, moderate varibility, 15x15 acel, negative decel Toco: occasional   Labs: Lab Results  Component Value Date   WBC 7.9 03/12/2018   HGB 11.5 (L) 03/12/2018   HCT 35.8 (L) 03/12/2018   MCV 88.8 03/12/2018   PLT 148 (L) 03/12/2018    Patient Active Problem List   Diagnosis Date Noted  . Gestational hypertension 03/12/2018  . Indication for care in labor or delivery 03/12/2018  . Unilateral choroid plexus cyst of fetus on prenatal ultrasound 11/11/2017  . Positive GBS test 09/24/2017  . Supervision of normal first pregnancy, antepartum 09/19/2017    Assessment / Plan: 27 y.o. G1P0000 at [redacted]w[redacted]d here for IOL 2/2 gHTN  Labor: patient cervical check ~1.5cm. Foley bulb was placed at this time.  Fetal Wellbeing:  Category I Pain Control:  None at this time, planning for epidural Anticipated MOD:  vaginal  Chelsey Anderson DO 03/12/2018, 7:11 PM

## 2018-03-12 NOTE — Patient Instructions (Signed)
Labor Induction Labor induction is when steps are taken to cause a pregnant woman to begin the labor process. Most women go into labor on their own between 37 weeks and 42 weeks of the pregnancy. When this does not happen or when there is a medical need, methods may be used to induce labor. Labor induction causes a pregnant woman's uterus to contract. It also causes the cervix to soften (ripen), open (dilate), and thin out (efface). Usually, labor is not induced before 39 weeks of the pregnancy unless there is a problem with the baby or mother. Before inducing labor, your health care provider will consider a number of factors, including the following:  The medical condition of you and the baby.  How many weeks along you are.  The status of the baby's lung maturity.  The condition of the cervix.  The position of the baby. What are the reasons for labor induction? Labor may be induced for the following reasons:  The health of the baby or mother is at risk.  The pregnancy is overdue by 1 week or more.  The water breaks but labor does not start on its own.  The mother has a health condition or serious illness, such as high blood pressure, infection, placental abruption, or diabetes.  The amniotic fluid amounts are low around the baby.  The baby is distressed. Convenience or wanting the baby to be born on a certain date is not a reason for inducing labor. What methods are used for labor induction? Several methods of labor induction may be used, such as:  Prostaglandin medicine. This medicine causes the cervix to dilate and ripen. The medicine will also start contractions. It can be taken by mouth or by inserting a suppository into the vagina.  Inserting a thin tube (catheter) with a balloon on the end into the vagina to dilate the cervix. Once inserted, the balloon is expanded with water, which causes the cervix to open.  Stripping the membranes. Your health care provider separates  amniotic sac tissue from the cervix, causing the cervix to be stretched and causing the release of a hormone called progesterone. This may cause the uterus to contract. It is often done during an office visit. You will be sent home to wait for the contractions to begin. You will then come in for an induction.  Breaking the water. Your health care provider makes a hole in the amniotic sac using a small instrument. Once the amniotic sac breaks, contractions should begin. This may still take hours to see an effect.  Medicine to trigger or strengthen contractions. This medicine is given through an IV access tube inserted into a vein in your arm. All of the methods of induction, besides stripping the membranes, will be done in the hospital. Induction is done in the hospital so that you and the baby can be carefully monitored. How long does it take for labor to be induced? Some inductions can take up to 2-3 days. Depending on the cervix, it usually takes less time. It takes longer when you are induced early in the pregnancy or if this is your first pregnancy. If a mother is still pregnant and the induction has been going on for 2-3 days, either the mother will be sent home or a cesarean delivery will be needed. What are the risks associated with labor induction? Some of the risks of induction include:  Changes in fetal heart rate, such as too high, too low, or erratic.  Fetal distress.    Chance of infection for the mother and baby.  Increased chance of having a cesarean delivery.  Breaking off (abruption) of the placenta from the uterus (rare).  Uterine rupture (very rare). When induction is needed for medical reasons, the benefits of induction may outweigh the risks. What are some reasons for not inducing labor? Labor induction should not be done if:  It is shown that your baby does not tolerate labor.  You have had previous surgeries on your uterus, such as a myomectomy or the removal of  fibroids.  Your placenta lies very low in the uterus and blocks the opening of the cervix (placenta previa).  Your baby is not in a head-down position.  The umbilical cord drops down into the birth canal in front of the baby. This could cut off the baby's blood and oxygen supply.  You have had a previous cesarean delivery.  There are unusual circumstances, such as the baby being extremely premature. This information is not intended to replace advice given to you by your health care provider. Make sure you discuss any questions you have with your health care provider. Document Released: 08/08/2006 Document Revised: 08/25/2015 Document Reviewed: 10/16/2012 Elsevier Interactive Patient Education  2017 [employer]  

## 2018-03-13 ENCOUNTER — Encounter (HOSPITAL_COMMUNITY): Payer: Self-pay | Admitting: Anesthesiology

## 2018-03-13 DIAGNOSIS — O4202 Full-term premature rupture of membranes, onset of labor within 24 hours of rupture: Secondary | ICD-10-CM

## 2018-03-13 DIAGNOSIS — O99824 Streptococcus B carrier state complicating childbirth: Secondary | ICD-10-CM

## 2018-03-13 DIAGNOSIS — Z3A37 37 weeks gestation of pregnancy: Secondary | ICD-10-CM

## 2018-03-13 DIAGNOSIS — O134 Gestational [pregnancy-induced] hypertension without significant proteinuria, complicating childbirth: Secondary | ICD-10-CM

## 2018-03-13 LAB — COMPREHENSIVE METABOLIC PANEL
ALT: 12 U/L (ref 0–44)
AST: 19 U/L (ref 15–41)
Albumin: 2.9 g/dL — ABNORMAL LOW (ref 3.5–5.0)
Alkaline Phosphatase: 106 U/L (ref 38–126)
Anion gap: 10 (ref 5–15)
BUN: <5 mg/dL — ABNORMAL LOW (ref 6–20)
CALCIUM: 8.3 mg/dL — AB (ref 8.9–10.3)
CO2: 21 mmol/L — ABNORMAL LOW (ref 22–32)
Chloride: 105 mmol/L (ref 98–111)
Creatinine, Ser: 0.57 mg/dL (ref 0.44–1.00)
GFR calc Af Amer: >60 mL/min (ref >60)
GFR calc non Af Amer: >60 mL/min (ref >60)
Glucose, Bld: 72 mg/dL (ref 70–99)
Potassium: 3.4 mmol/L — ABNORMAL LOW (ref 3.5–5.1)
Sodium: 136 mmol/L (ref 135–145)
TOTAL PROTEIN: 6 g/dL — AB (ref 6.5–8.1)
Total Bilirubin: 1 mg/dL (ref 0.3–1.2)

## 2018-03-13 LAB — CBC
HCT: 33.2 % — ABNORMAL LOW (ref 36.0–46.0)
Hemoglobin: 10.6 g/dL — ABNORMAL LOW (ref 12.0–15.0)
MCH: 28.4 pg (ref 26.0–34.0)
MCHC: 31.9 g/dL (ref 30.0–36.0)
MCV: 89 fL (ref 80.0–100.0)
Platelets: 125 10*3/uL — ABNORMAL LOW (ref 150–400)
RBC: 3.73 MIL/uL — AB (ref 3.87–5.11)
RDW: 14.5 % (ref 11.5–15.5)
WBC: 10 10*3/uL (ref 4.0–10.5)
nRBC: 0 % (ref 0.0–0.2)

## 2018-03-13 LAB — RPR: RPR Ser Ql: NONREACTIVE

## 2018-03-13 MED ORDER — LACTATED RINGERS IV SOLN: 500.0000 mL | Freq: Once | INTRAVENOUS | Status: DC

## 2018-03-13 MED ORDER — ONDANSETRON HCL 4 MG PO TABS: 4.0000 mg | ORAL_TABLET | ORAL | Status: DC | PRN

## 2018-03-13 MED ORDER — TERBUTALINE SULFATE 1 MG/ML IJ SOLN
0.2500 mg | Freq: Once | INTRAMUSCULAR | Status: DC | PRN
  Filled 2018-03-13: qty 1

## 2018-03-13 MED ORDER — DIPHENHYDRAMINE HCL 25 MG PO CAPS: 25.0000 mg | ORAL_CAPSULE | Freq: Four times a day (QID) | ORAL | Status: DC | PRN

## 2018-03-13 MED ORDER — DIBUCAINE 1 % RE OINT: 1.0000 "application " | TOPICAL_OINTMENT | RECTAL | Status: DC | PRN

## 2018-03-13 MED ORDER — PHENYLEPHRINE 40 MCG/ML (10ML) SYRINGE FOR IV PUSH (FOR BLOOD PRESSURE SUPPORT)
80.0000 ug | PREFILLED_SYRINGE | INTRAVENOUS | Status: DC | PRN
  Filled 2018-03-13: qty 10

## 2018-03-13 MED ORDER — PHENYLEPHRINE 40 MCG/ML (10ML) SYRINGE FOR IV PUSH (FOR BLOOD PRESSURE SUPPORT)
80.0000 ug | PREFILLED_SYRINGE | INTRAVENOUS | Status: DC | PRN
  Filled 2018-03-13 (×2): qty 10

## 2018-03-13 MED ORDER — WITCH HAZEL-GLYCERIN EX PADS: 1.0000 "application " | MEDICATED_PAD | CUTANEOUS | Status: DC | PRN

## 2018-03-13 MED ORDER — ZOLPIDEM TARTRATE 5 MG PO TABS: 5.0000 mg | ORAL_TABLET | Freq: Every evening | ORAL | Status: DC | PRN

## 2018-03-13 MED ORDER — ONDANSETRON HCL 4 MG/2ML IJ SOLN: 4.0000 mg | INTRAMUSCULAR | Status: DC | PRN

## 2018-03-13 MED ORDER — COCONUT OIL OIL: 1.0000 "application " | TOPICAL_OIL | Status: DC | PRN

## 2018-03-13 MED ORDER — BENZOCAINE-MENTHOL 20-0.5 % EX AERO
1.0000 "application " | INHALATION_SPRAY | CUTANEOUS | Status: DC | PRN
  Administered 2018-03-14: 1 via TOPICAL
  Filled 2018-03-13: qty 112

## 2018-03-13 MED ORDER — PRENATAL MULTIVITAMIN CH
1.0000 | ORAL_TABLET | Freq: Every day | ORAL | Status: DC
  Administered 2018-03-14 – 2018-03-15 (×2): 1 via ORAL
  Filled 2018-03-13 (×2): qty 1

## 2018-03-13 MED ORDER — EPHEDRINE 5 MG/ML INJ
10.0000 mg | INTRAVENOUS | Status: DC | PRN
  Filled 2018-03-13: qty 2

## 2018-03-13 MED ORDER — OXYTOCIN 40 UNITS IN LACTATED RINGERS INFUSION - SIMPLE MED
1.0000 m[IU]/min | INTRAVENOUS | Status: DC
  Administered 2018-03-13: 2 m[IU]/min via INTRAVENOUS

## 2018-03-13 MED ORDER — ACETAMINOPHEN 325 MG PO TABS: 650.0000 mg | ORAL_TABLET | ORAL | Status: DC | PRN

## 2018-03-13 MED ORDER — TETANUS-DIPHTH-ACELL PERTUSSIS 5-2.5-18.5 LF-MCG/0.5 IM SUSP: 0.5000 mL | Freq: Once | INTRAMUSCULAR | Status: DC

## 2018-03-13 MED ORDER — FENTANYL 2.5 MCG/ML BUPIVACAINE 1/10 % EPIDURAL INFUSION (WH - ANES)
14.0000 mL/h | INTRAMUSCULAR | Status: DC | PRN
  Filled 2018-03-13: qty 100

## 2018-03-13 MED ORDER — SENNOSIDES-DOCUSATE SODIUM 8.6-50 MG PO TABS
2.0000 | ORAL_TABLET | ORAL | Status: DC
  Administered 2018-03-14 – 2018-03-15 (×2): 2 via ORAL
  Filled 2018-03-13 (×2): qty 2

## 2018-03-13 MED ORDER — DIPHENHYDRAMINE HCL 50 MG/ML IJ SOLN: 12.5000 mg | INTRAMUSCULAR | Status: DC | PRN

## 2018-03-13 MED ORDER — SIMETHICONE 80 MG PO CHEW: 80.0000 mg | CHEWABLE_TABLET | ORAL | Status: DC | PRN

## 2018-03-13 MED ORDER — IBUPROFEN 600 MG PO TABS
600.0000 mg | ORAL_TABLET | Freq: Four times a day (QID) | ORAL | Status: DC
  Administered 2018-03-14 – 2018-03-15 (×7): 600 mg via ORAL
  Filled 2018-03-13 (×7): qty 1

## 2018-03-13 NOTE — Discharge Summary (Addendum)
Postpartum Discharge Summary     Patient Name: Tiffany Ruiz DOB: [birth year] MRN: [id number]  Date of admission: 03/12/2018 Delivering Provider: Richarda Osmond   Date of discharge: 03/15/2018  Admitting diagnosis: GESTIONAL HYPERTENSION Intrauterine pregnancy: [redacted]w[redacted]d     Secondary diagnosis:  Active Problems:   Postpartum care following vaginal delivery  Additional problems: None     Discharge diagnosis: Term Pregnancy Delivered and Gestational Hypertension                                                                                    Post partum procedures:None  Augmentation: Pitocin, Cytotec and Foley Balloon  Complications: None  Hospital course:  Induction of Labor With Vaginal Delivery   27 y.o. yo G1P1001 at [redacted]w[redacted]d was admitted to the hospital 03/12/2018 for induction of labor.  Indication for induction: Gestational hypertension.  Patient had an uncomplicated labor course as follows: Membrane Rupture Time/Date: 3:25 PM ,03/13/2018   Intrapartum Procedures: Episiotomy: None [1]                                         Lacerations:  2nd degree [3];Perineal [11]  Patient had delivery of a Viable infant.  Information for the patient's newborn:  Jenah, Vanasten [[id number]]  Delivery Method: Vaginal, Spontaneous(Filed from Delivery Summary)   03/13/2018  Details of delivery can be found in separate delivery note.  Patient had a routine postpartum course. Patient is discharged home 03/15/18.  Magnesium Sulfate recieved: No BMZ received: No  Physical exam  Vitals:   03/14/18 0915 03/14/18 1459 03/14/18 2228 03/15/18 0608  BP: 114/73 125/80 113/71 130/88  Pulse: 88 68 71 78  Resp: 16  18 20   Temp: 76.8 F (36.8 C) 98.1 F (36.7 C) 98.4 F (36.9 C) 98.3 F (36.8 C)  TempSrc: Oral Oral Oral Oral  SpO2: 100% 99%  99%  Weight:      Height:       General: alert, cooperative and no distress Lochia: appropriate Uterine Fundus: firm Incision:  N/A DVT Evaluation: No evidence of DVT seen on physical exam. Negative Homan's sign. No cords or calf tenderness. No significant calf/ankle edema. Labs: Lab Results  Component Value Date   WBC 13.6 (H) 03/14/2018   HGB 10.3 (L) 03/14/2018   HCT 31.8 (L) 03/14/2018   MCV 86.6 03/14/2018   PLT 158 03/14/2018   CMP Latest Ref Rng & Units 03/13/2018  Glucose 70 - 99 mg/dL 72  BUN 6 - 20 mg/dL <5(L)  Creatinine 0.44 - 1.00 mg/dL 0.57  Sodium 135 - 145 mmol/L 136  Potassium 3.5 - 5.1 mmol/L 3.4(L)  Chloride 98 - 111 mmol/L 105  CO2 22 - 32 mmol/L 21(L)  Calcium 8.9 - 10.3 mg/dL 8.3(L)  Total Protein 6.5 - 8.1 g/dL 6.0(L)  Total Bilirubin 0.3 - 1.2 mg/dL 1.0  Alkaline Phos 38 - 126 U/L 106  AST 15 - 41 U/L 19  ALT 0 - 44 U/L 12    Discharge instruction: per After Visit Summary and "Baby and Me Booklet".  After visit  meds:  Allergies as of 03/15/2018   No Known Allergies     Medication List    STOP taking these medications   cyclobenzaprine 10 MG tablet Commonly known as:  FLEXERIL   docusate sodium 100 MG capsule Commonly known as:  COLACE   hydrocortisone cream 0.5 %   ranitidine 150 MG tablet Commonly known as:  ZANTAC   terconazole 0.4 % vaginal cream Commonly known as:  TERAZOL 7     TAKE these medications   ferrous sulfate 325 (65 FE) MG tablet Commonly known as:  FERROUSUL Take 1 tablet (325 mg total) by mouth daily with breakfast.   ibuprofen 600 MG tablet Commonly known as:  ADVIL,MOTRIN Take 1 tablet (600 mg total) by mouth every 6 (six) hours.   PRENATAL 27-1 MG Tabs Take 1 tablet by mouth daily.   senna-docusate 8.6-50 MG tablet Commonly known as:  Senokot-S Take 2 tablets by mouth daily for 7 days. Start taking on:  March 16, 2018       Diet: routine diet  Activity: Advance as tolerated. Pelvic rest for 6 weeks.   Outpatient follow up:4 weeks Follow up Appt:No future appointments. Follow up Visit:   Please schedule this  patient for Postpartum visit in: 4 weeks with the following provider: Any provider For C/S patients schedule nurse incision check in weeks 2 weeks: no High risk pregnancy complicated by: gHTN Delivery mode:  SVD Anticipated Birth Control:  Declined PP Procedures needed: None  Schedule Integrated BH visit: no  Newborn Data: Live born female  Birth Weight: 6 lb 14.2 oz (3125 g) APGAR: 9, 9  Newborn Delivery   Birth date/time:  03/13/2018 17:22:00 Delivery type:  Vaginal, Spontaneous     Baby Feeding: Breast Disposition:home with mother   03/15/2018 Adline Potter, MD  OB [place]  I have seen and examined this patient and agree with above documentation in the resident's note.   Aura Camps, MD  OB Fellow  03/15/2018, 9:15 AM

## 2018-03-13 NOTE — Progress Notes (Signed)
Feeling well, feeling periodic contractions. Planning for Fentanyl and Nitrous Oxide for pain. Answered general questions about the birthing process. -[name] MS3

## 2018-03-13 NOTE — Anesthesia Preprocedure Evaluation (Deleted)
Anesthesia Evaluation  Patient identified by MRN, date of birth, ID band Patient awake    Reviewed: Allergy & Precautions, Patient's Chart, lab work & pertinent test results  Airway Mallampati: II  TM Distance: >3 FB Neck ROM: Full  Mouth opening: Limited Mouth Opening  Dental no notable dental hx. (+) Teeth Intact   Pulmonary neg pulmonary ROS,    Pulmonary exam normal breath sounds clear to auscultation       Cardiovascular hypertension, Normal cardiovascular exam Rhythm:Regular Rate:Normal     Neuro/Psych negative neurological ROS  negative psych ROS   GI/Hepatic Neg liver ROS, GERD  ,  Endo/Other  Obesity  Renal/GU negative Renal ROS  negative genitourinary   Musculoskeletal   Abdominal (+) + obese,   Peds  Hematology  (+) anemia , Thrombocytopenia-mild   Anesthesia Other Findings   Reproductive/Obstetrics 37 4/[redacted] weeks Gestational HTN- no meds, no high BP's                             Anesthesia Physical Anesthesia Plan  ASA: III  Anesthesia Plan:    Post-op Pain Management:    Induction:   PONV Risk Score and Plan:   Airway Management Planned: Natural Airway  Additional Equipment:   Intra-op Plan:   Post-operative Plan:   Informed Consent: I have reviewed the patients History and Physical, chart, labs and discussed the procedure including the risks, benefits and alternatives for the proposed anesthesia with the patient or authorized representative who has indicated his/her understanding and acceptance.     Plan Discussed with: Anesthesiologist  Anesthesia Plan Comments: (Patient felt urge to push. RN checked patient and she was fully dilated. No procedure performed.)       Anesthesia Quick Evaluation

## 2018-03-13 NOTE — Lactation Note (Signed)
This note was copied from a baby's chart. Lactation Consultation Note  Patient Name: Tiffany Ruiz Today's Date: 03/13/2018 Reason for consult: Initial assessment;Primapara;1st time breastfeeding;Early term 35-38.6wks  6 hours old early term female who is being exclusively BF by her mother, she's a P1. Mom took childbirth classes [facility] at [facility] and she already knows how to hand express. When reviewing hand expression, small droplets of colostrum were observed, also noticed that mom has very short shafted nipples; [place] set her up with breast shells, instructions, cleaning and storage were reviewed; mom brought a nursing bra to the hospital. Mom doesn't have a pump at home, [facility] offered one from the hospital, pump instructions, cleaning and storage were also reviewed as well as milk storage guidelines.  Offered assistance with latch and mom agreed to wake baby up to feed. LC did some suck training with baby and noticed she had a somehow uncoordinated suck, it took a few minutes to get her to suck in a rhythmical pattern. LC took baby STS to mom's left breast in cross cradle position and she was able to latch after a few tries. LC did the teacup hold with mom's nipple/areola complex, she may be short shafted but her tissue is very compressible and baby was able to feed for 8 minutes. Parents were very pleased.  Both parents were very receptive to learning and had lots of questions. Discussed cluster feeding, newborn sleeping cycle and size of baby's stomach. They're very cooperative and did everything LC suggested.  Feeding plan  1. Encouraged mom to feed baby STS 8-12 times/24 hours or sooner if feeding cues are present 2. Hand expression and spoon feeding was also encouraged 3. Mom will start wearing her breast shells tomorrow, daytime only 3. She'll also do some pre-pumping prior taking baby to the breast  BF brochure, BF resources and feeding diary were reviewed. Parents reported all questions  and concerns were answered, they're both aware of [place] services and will call PRN.    Maternal Data Formula Feeding for Exclusion: No Has patient been taught Hand Expression?: Yes Does the patient have breastfeeding experience prior to this delivery?: No  Feeding Feeding Type: Breast Fed  LATCH Score Latch: Repeated attempts needed to sustain latch, nipple held in mouth throughout feeding, stimulation needed to elicit sucking reflex.  Audible Swallowing: A few with stimulation  Type of Nipple: Everted at rest and after stimulation(short shafted)  Comfort (Breast/Nipple): Soft / non-tender  Hold (Positioning): Assistance needed to correctly position infant at breast and maintain latch.  LATCH Score: 7  Interventions Interventions: Breast feeding basics reviewed;Assisted with latch;Skin to skin;Breast massage;Hand express;Reverse pressure;Breast compression;Adjust position;Support pillows;Shells;Hand pump  Lactation Tools Discussed/Used Tools: Shells;Pump Shell Type: Inverted Breast pump type: Manual WIC Program: No Pump Review: Setup, frequency, and cleaning;Milk Storage Initiated by:: MPeck Date initiated:: 03/13/18   Consult Status Consult Status: Follow-up Date: 03/14/18 Follow-up type: In-patient    Terrin Imparato Francene Boyers 03/13/2018, 11:58 PM

## 2018-03-13 NOTE — Progress Notes (Signed)
LABOR PROGRESS NOTE  Finola Rosal is a 27 y.o. G1P0000 at [redacted]w[redacted]d  admitted for IOL 2/2 gHTN  Subjective: Patient appears to be comfortable in bed. She is ambulating frequently.   Objective: BP 118/66   Pulse 66   Temp 98.4 F (36.9 C) (Oral)   Resp 16   Ht 5\' 2"  (1.575 m)   Wt 85 kg   LMP 06/23/2017   BMI 34.29 kg/m  or  Vitals:   03/13/18 0900 03/13/18 0930 03/13/18 1000 03/13/18 1030  BP: 120/66 (!) 109/52 117/89 118/66  Pulse: 69 63 (!) 103 66  Resp: 16 16 16 16   Temp:      TempSrc:      Weight:      Height:        ~1100 Dilation: 4 Effacement (%): 80 Cervical Position: Posterior Station: -2 Presentation: Vertex Exam by:: Mabeline Caras, RN FHT: baseline rate 145, moderate varibility, 15x15 acel, negative decel Toco: occasional   Labs: Lab Results  Component Value Date   WBC 7.9 03/12/2018   HGB 11.5 (L) 03/12/2018   HCT 35.8 (L) 03/12/2018   MCV 88.8 03/12/2018   PLT 148 (L) 03/12/2018    Patient Active Problem List   Diagnosis Date Noted  . Gestational hypertension 03/12/2018  . Indication for care in labor or delivery 03/12/2018  . Unilateral choroid plexus cyst of fetus on prenatal ultrasound 11/11/2017  . Positive GBS test 09/24/2017  . Supervision of normal first pregnancy, antepartum 09/19/2017    Assessment / Plan: 27 y.o. G1P0000 at [redacted]w[redacted]d here for IOL 2/2 gHTN. Blood pressures have been well controlled for past 24hr  Labor: progressing well. Cervix is 6cm, 50%, -2. Bulging bag on exam. Pitocin has been running at 9- increased to 12 at this time. Spoke to patient about considering AROM in future and she is agreeable.  Fetal Wellbeing:  Category I Pain Control:  Fentanyl IV order in PRN Anticipated MOD:  vaginal  Chelsey Anderson DO 03/13/2018, 10:59 AM

## 2018-03-14 LAB — CBC
HCT: 31.8 % — ABNORMAL LOW (ref 36.0–46.0)
HEMOGLOBIN: 10.3 g/dL — AB (ref 12.0–15.0)
MCH: 28.1 pg (ref 26.0–34.0)
MCHC: 32.4 g/dL (ref 30.0–36.0)
MCV: 86.6 fL (ref 80.0–100.0)
Platelets: 158 10*3/uL (ref 150–400)
RBC: 3.67 MIL/uL — ABNORMAL LOW (ref 3.87–5.11)
RDW: 14.6 % (ref 11.5–15.5)
WBC: 13.6 10*3/uL — ABNORMAL HIGH (ref 4.0–10.5)
nRBC: 0 % (ref 0.0–0.2)

## 2018-03-14 NOTE — Progress Notes (Addendum)
POSTPARTUM PROGRESS NOTE  Post Partum Day 1  Subjective:  Tiffany Ruiz is a 27 y.o. G1P1001 s/p SVD at [redacted]w[redacted]d She had a 2nd degree laceration. She reports she is doing well. No acute events overnight. She denies any problems with ambulating, voiding or po intake. Denies nausea or vomiting.  Pain is well controlled.  Lochia is mild and decreased.  Objective: Blood pressure 120/73, pulse 69, temperature 98 F (36.7 C), temperature source Oral, resp. rate 18, height '5\' 2"'  (1.575 m), weight 85 kg, last menstrual period 06/23/2017, SpO2 100 %, unknown if currently breastfeeding.  Physical Exam:  General: alert, cooperative and no distress Chest: no respiratory distress Heart:regular rate, distal pulses intact Abdomen: soft, nontender Uterine Fundus: firm, appropriately tender DVT Evaluation: No calf swelling or tenderness Extremities: Trace bilateral edema Skin: warm, dry  Recent Labs    03/13/18 1353 03/14/18 0539  HGB 10.6* 10.3*  HCT 33.2* 31.8*    Assessment/Plan: Tiffany Crowellis a 27y.o. G1P1001 s/p SVD at 319w4d PPD#1 - Doing well Routine postpartum care Contraception: Condoms Feeding: Breast Dispo: Plan for discharge tomorrow.   LOS: 2 days   ElCarilyn GoodpastureS3 03/14/2018, 9:04 AM  I personally saw and evaluated the patient, performing the key elements of the service. I developed and verified the management plan that is described in the resident's/student's note, and I agree with the content with my edits above. VSS, HRR&R, Resp unlabored, Legs neg.  FrNigel BertholdCNM 03/18/2018 9:57 AM

## 2018-03-14 NOTE — Lactation Note (Signed)
This note was copied from a baby's chart. Lactation Consultation Note  Patient Name: Tiffany Ruiz [id number] Date: 03/14/2018 Reason for consult: Follow-up assessment Mom states baby had a good feeding this morning but baby sleepy after bath.  Skin to skin on mom's chest.  Instructed to watch for feeding cues and call for assist prn.  First 24 hour newborn behavior discussed.  Maternal Data    Feeding Feeding Type: Breast Fed  LATCH Score Latch: Repeated attempts needed to sustain latch, nipple held in mouth throughout feeding, stimulation needed to elicit sucking reflex.  Audible Swallowing: A few with stimulation  Type of Nipple: Everted at rest and after stimulation  Comfort (Breast/Nipple): Soft / non-tender  Hold (Positioning): Assistance needed to correctly position infant at breast and maintain latch.  LATCH Score: 7  Interventions Interventions: Breast feeding basics reviewed;Assisted with latch;Skin to skin;Breast massage;Hand express;Breast compression;Support pillows;Adjust position  Lactation Tools Discussed/Used     Consult Status Consult Status: Follow-up Date: 03/15/18 Follow-up type: In-patient    Ave Filter 03/14/2018, 10:38 AM

## 2018-03-15 ENCOUNTER — Encounter (HOSPITAL_COMMUNITY): Payer: Self-pay | Admitting: Student in an Organized Health Care Education/Training Program

## 2018-03-15 MED ORDER — IBUPROFEN 600 MG PO TABS: 600.0000 mg | ORAL_TABLET | Freq: Four times a day (QID) | ORAL | 0 refills | Status: DC

## 2018-03-15 MED ORDER — SENNOSIDES-DOCUSATE SODIUM 8.6-50 MG PO TABS: 2.0000 | ORAL_TABLET | ORAL | 0 refills | Status: AC

## 2018-03-15 NOTE — Lactation Note (Signed)
This note was copied from a baby's chart. Lactation Consultation Note  Patient Name: Girl Adaia Matthies FTDDU'K Date: 03/15/2018 Reason for consult: Follow-up assessment;Early term 37-38.6wks;Primapara Mom states feedings are going well.  Breasts are leaking.  Discussed milk coming to volume and the prevention and treatment of engorgement.  Mom has a manual pump for prn use and plans on purchasing a DEBP.  Lactation outpatient services and support reviewed and encouraged prn.  Maternal Data    Feeding Feeding Type: Breast Fed  LATCH Score                   Interventions    Lactation Tools Discussed/Used     Consult Status Consult Status: Complete Follow-up type: Call as needed    Ave Filter 03/15/2018, 12:23 PM

## 2018-03-17 ENCOUNTER — Telehealth: Payer: Self-pay | Admitting: *Deleted

## 2018-03-17 NOTE — Telephone Encounter (Signed)
Left message on voice mail for pt to contact us to schedule b/p check and pp appointment.  03-17-18  AS

## 2018-03-20 ENCOUNTER — Encounter (HOSPITAL_COMMUNITY): Payer: Self-pay | Admitting: *Deleted

## 2018-03-20 ENCOUNTER — Ambulatory Visit: Payer: Medicaid Other

## 2018-03-20 ENCOUNTER — Inpatient Hospital Stay (HOSPITAL_COMMUNITY)
Admission: AD | Admit: 2018-03-20 | Discharge: 2018-03-20 | Disposition: A | Discharge status: Home / Self Care | Payer: Medicaid Other | Source: Ambulatory Visit | Attending: Obstetrics and Gynecology | Admitting: Obstetrics and Gynecology

## 2018-03-20 VITALS — BP 172/114 | HR 66 | Wt 168.0 lb

## 2018-03-20 DIAGNOSIS — O165 Unspecified maternal hypertension, complicating the puerperium: Secondary | ICD-10-CM

## 2018-03-20 LAB — COMPREHENSIVE METABOLIC PANEL
ALT: 40 U/L (ref 0–44)
AST: 26 U/L (ref 15–41)
Albumin: 3.8 g/dL (ref 3.5–5.0)
Alkaline Phosphatase: 87 U/L (ref 38–126)
Anion gap: 9 (ref 5–15)
BUN: 10 mg/dL (ref 6–20)
CALCIUM: 9.2 mg/dL (ref 8.9–10.3)
CO2: 26 mmol/L (ref 22–32)
CREATININE: 0.57 mg/dL (ref 0.44–1.00)
Chloride: 102 mmol/L (ref 98–111)
GFR calc non Af Amer: >60 mL/min (ref >60)
Glucose, Bld: 88 mg/dL (ref 70–99)
Potassium: 3.6 mmol/L (ref 3.5–5.1)
Sodium: 137 mmol/L (ref 135–145)
Total Bilirubin: 0.7 mg/dL (ref 0.3–1.2)
Total Protein: 7.4 g/dL (ref 6.5–8.1)

## 2018-03-20 LAB — CBC
HCT: 39.4 % (ref 36.0–46.0)
Hemoglobin: 12.8 g/dL (ref 12.0–15.0)
MCH: 28.3 pg (ref 26.0–34.0)
MCHC: 32.5 g/dL (ref 30.0–36.0)
MCV: 87.2 fL (ref 80.0–100.0)
Platelets: 268 10*3/uL (ref 150–400)
RBC: 4.52 MIL/uL (ref 3.87–5.11)
RDW: 14.1 % (ref 11.5–15.5)
WBC: 8 10*3/uL (ref 4.0–10.5)
nRBC: 0 % (ref 0.0–0.2)

## 2018-03-20 LAB — PROTEIN / CREATININE RATIO, URINE
Creatinine, Urine: 47 mg/dL
Total Protein, Urine: <6 mg/dL

## 2018-03-20 MED ORDER — HYDRALAZINE HCL 20 MG/ML IJ SOLN: 10.0000 mg | INTRAMUSCULAR | Status: DC | PRN

## 2018-03-20 MED ORDER — OXYCODONE-ACETAMINOPHEN 5-325 MG PO TABS
2.0000 | ORAL_TABLET | Freq: Once | ORAL | Status: AC
  Administered 2018-03-20: 2 via ORAL
  Filled 2018-03-20: qty 2

## 2018-03-20 MED ORDER — IBUPROFEN 800 MG PO TABS
800.0000 mg | ORAL_TABLET | Freq: Once | ORAL | Status: AC
  Administered 2018-03-20: 800 mg via ORAL
  Filled 2018-03-20: qty 1

## 2018-03-20 MED ORDER — LABETALOL HCL 5 MG/ML IV SOLN: 40.0000 mg | INTRAVENOUS | Status: DC | PRN

## 2018-03-20 MED ORDER — LABETALOL HCL 5 MG/ML IV SOLN: 20.0000 mg | INTRAVENOUS | Status: DC | PRN

## 2018-03-20 MED ORDER — NIFEDIPINE ER OSMOTIC RELEASE 30 MG PO TB24: 30.0000 mg | ORAL_TABLET | Freq: Every day | ORAL | 0 refills | Status: AC

## 2018-03-20 MED ORDER — LABETALOL HCL 5 MG/ML IV SOLN: 80.0000 mg | INTRAVENOUS | Status: DC | PRN

## 2018-03-20 NOTE — MAU Provider Note (Signed)
History     CSN: [id number]  Arrival date and time: 03/20/18 7741   First Provider Initiated Contact with Patient 03/20/18 1014      Chief Complaint  Patient presents with  . Hypertension   Tiffany Ruiz is a 27 y.o. G1P1001 who is 7 days PP S/P NSVD after IOL for GHTN. She never had severe range BP while here, and BP normalized after delivery. She did not receive magnesium while here in labor, and was not DC home on blood pressure medication. She was seen today for blood pressure check and found to have severe range blood pressure in the office. She has had headaches this week, and has a headache right now. She states the headache is bilateral/temporal that she rates 4/10. Last night it was 10/10. She took ibuprofen and went to bed. This helped the headache. She hasn't taken anything for headache today. She denies visual disturbance or RUQ pain.    OB History    Gravida  1   Para  1   Term  1   Preterm  0   AB  0   Living  1     SAB  0   TAB  0   Ectopic  0   Multiple  0   Live Births  1           Past Medical History:  Diagnosis Date  . Gestational hypertension 03/12/2018  . Hypertension   . Nose fracture   . Supervision of normal first pregnancy, antepartum 09/19/2017    Nursing Staff Provider Office Location  [facility] Dating   LMP Language   English Anatomy US  WNL-incomplete, [place] seen-f/u in 4 wks sched Flu Vaccine   declined 9/9 Genetic Screen  NIPS:low risks   AFP:   First Screen:  Quad:   TDaP vaccine   02/24/18 Hgb A1C or  GTT Early 5.4 Third trimester  Rhogam  N/a O+   LAB RESULTS  Feeding Plan  breast Blood Type O/Positive/-- (06/20 1650)  Contraception dec  . Unilateral choroid plexus cyst of fetus on prenatal ultrasound 11/11/2017   No clinical significance, had negative NIPS    Past Surgical History:  Procedure Laterality Date  . MOUTH SURGERY  2011   wisdom tooth   . NOSE SURGERY      Family History  Problem Relation Age of Onset  .  Stroke Maternal Grandfather     Social History   Tobacco Use  . Smoking status: Never Smoker  . Smokeless tobacco: Never Used  Substance Use Topics  . Alcohol use: Not Currently  . Drug use: Never    Allergies: No Known Allergies  Medications Prior to Admission  Medication Sig Dispense Refill Last Dose  . ferrous sulfate (FERROUSUL) 325 (65 FE) MG tablet Take 1 tablet (325 mg total) by mouth daily with breakfast. 30 tablet 5 Taking  . ibuprofen (ADVIL,MOTRIN) 600 MG tablet Take 1 tablet (600 mg total) by mouth every 6 (six) hours. 30 tablet 0 Taking  . PRENATAL 27-1 MG TABS Take 1 tablet by mouth daily.  10 Taking  . senna-docusate (SENOKOT-S) 8.6-50 MG tablet Take 2 tablets by mouth daily for 7 days. (Patient not taking: Reported on 03/20/2018) 14 tablet 0 Not Taking    Review of Systems Physical Exam   Blood pressure (!) 145/84, pulse 78, temperature 98.6 F (37 C), temperature source Oral, resp. rate 18, weight 76.3 kg, last menstrual period 06/23/2017, unknown if currently breastfeeding.  Physical Exam  Nursing note and vitals reviewed. Constitutional: She is oriented to person, place, and time. She appears well-developed and well-nourished. No distress.  HENT:  Head: Normocephalic.  Cardiovascular: Normal rate.  Respiratory: Effort normal.  GI: Soft. There is no abdominal tenderness. There is no rebound.  Musculoskeletal: Normal range of motion.  Neurological: She is alert and oriented to person, place, and time.  Skin: Skin is warm and dry.  Psychiatric: She has a normal mood and affect.     Results for orders placed or performed during the hospital encounter of 03/20/18 (from the past 24 hour(s))  CBC     Status: None   Collection Time: 03/20/18 10:01 AM  Result Value Ref Range   WBC 8.0 4.0 - 10.5 K/uL   RBC 4.52 3.87 - 5.11 MIL/uL   Hemoglobin 12.8 12.0 - 15.0 g/dL   HCT 39.4 36.0 - 46.0 %   MCV 87.2 80.0 - 100.0 fL   MCH 28.3 26.0 - 34.0 pg   MCHC 32.5  30.0 - 36.0 g/dL   RDW 14.1 11.5 - 15.5 %   Platelets 268 150 - 400 K/uL   nRBC 0.0 0.0 - 0.2 %  Comprehensive metabolic panel     Status: None   Collection Time: 03/20/18 10:01 AM  Result Value Ref Range   Sodium 137 135 - 145 mmol/L   Potassium 3.6 3.5 - 5.1 mmol/L   Chloride 102 98 - 111 mmol/L   CO2 26 22 - 32 mmol/L   Glucose, Bld 88 70 - 99 mg/dL   BUN 10 6 - 20 mg/dL   Creatinine, Ser 0.57 0.44 - 1.00 mg/dL   Calcium 9.2 8.9 - 10.3 mg/dL   Total Protein 7.4 6.5 - 8.1 g/dL   Albumin 3.8 3.5 - 5.0 g/dL   AST 26 15 - 41 U/L   ALT 40 0 - 44 U/L   Alkaline Phosphatase 87 38 - 126 U/L   Total Bilirubin 0.7 0.3 - 1.2 mg/dL   GFR calc non Af Amer >60 >60 mL/min   GFR calc Af Amer >60 >60 mL/min   Anion gap 9 5 - 15  Protein / creatinine ratio, urine     Status: None   Collection Time: 03/20/18 10:18 AM  Result Value Ref Range   Creatinine, Urine 47.00 mg/dL   Total Protein, Urine <6 mg/dL   Protein Creatinine Ratio        0.00 - 0.15 mg/mg[Cre]     MAU Course  Procedures  MDM Patient has had ibuprofen. She reports that her headache has improved, but has not gone away completley. She rates it 1/10  12:43 PM consult with Dr. Ilda Basset, will give oxycodone for headache if cannot get the headache to completley resolve then we will admit for mag. If headache resolves then patient can be dc home on procardia 30xl, and FU in the office for BP check.   1:46 PM patient has had oxycodone for headache. She rates her pain 0/10 at this time.    Assessment and Plan   1. Postpartum hypertension   2. Postpartum care following vaginal delivery    DC home Comfort measures reviewed  Pre-eclampsia warning signs reviewed  RX: procardia 30mg  XL Q day  Return to MAU as needed FU with OB as planned  [place] for [place] Follow up.   Specialty:  Obstetrics and Gynecology Why:  Monday 12/23 at 11:00 for blood pressure check  Contact  information: [phone]  [address] [place], CNM  03/20/18  11:34 AM

## 2018-03-20 NOTE — Discharge Instructions (Signed)
.  Preeclampsia and Eclampsia    Preeclampsia is a serious condition that may develop during pregnancy. It is also called toxemia of pregnancy. This condition causes high blood pressure along with other symptoms, such as swelling and headaches. These symptoms may develop as the condition gets worse. Preeclampsia may occur at 20 weeks of pregnancy or later.  Diagnosing and treating preeclampsia early is very important. If not treated early, it can cause serious problems for you and your baby. One problem it can lead to is eclampsia. Eclampsia is a condition that causes muscle jerking or shaking (convulsions or seizures) and other serious problems for the mother. During pregnancy, delivering your baby may be the best treatment for preeclampsia or eclampsia. For most women, preeclampsia and eclampsia symptoms go away after giving birth.  In rare cases, a woman may develop preeclampsia after giving birth (postpartum preeclampsia). This usually occurs within 48 hours after childbirth but may occur up to 6 weeks after giving birth.  What are the causes?  The cause of preeclampsia is not known.  What increases the risk?  The following risk factors make you more likely to develop preeclampsia:   Being pregnant for the first time.   Having had preeclampsia during a past pregnancy.   Having a family history of preeclampsia.   Having high blood pressure.   Being pregnant with more than one baby.   Being 35 or older.   Being African-American.   Having kidney disease or diabetes.   Having medical conditions such as lupus or blood diseases.   Being very overweight (obese).  What are the signs or symptoms?  The earliest signs of preeclampsia are:   High blood pressure.   Increased protein in your urine. Your health care provider will check for this at every visit before you give birth (prenatal visit).  Other symptoms that may develop as the condition gets worse include:   Severe headaches.   Sudden weight  gain.   Swelling of the hands, face, legs, and feet.   Nausea and vomiting.   Vision problems, such as blurred or double vision.   Numbness in the face, arms, legs, and feet.   Urinating less than usual.   Dizziness.   Slurred speech.   Abdominal pain, especially upper abdominal pain.   Convulsions or seizures.  How is this diagnosed?  There are no screening tests for preeclampsia. Your health care provider will ask you about symptoms and check for signs of preeclampsia during your prenatal visits. You may also have tests that include:   Urine tests.   Blood tests.   Checking your blood pressure.   Monitoring your baby's heart rate.   Ultrasound.  How is this treated?  You and your health care provider will determine the treatment approach that is best for you. Treatment may include:   Having more frequent prenatal exams to check for signs of preeclampsia, if you have an increased risk for preeclampsia.   Medicine to lower your blood pressure.   Staying in the hospital, if your condition is severe. There, treatment will focus on controlling your blood pressure and the amount of fluids in your body (fluid retention).   Taking medicine (magnesium sulfate) to prevent seizures. This may be given as an injection or through an IV.   Taking a low-dose aspirin during your pregnancy.   Delivering your baby early, if your condition gets worse. You may have your labor started with medicine (induced), or you may have a cesarean   delivery.  Follow these instructions at home:  Eating and drinking     Drink enough fluid to keep your urine pale yellow.   Avoid caffeine.  Lifestyle   Do not use any products that contain nicotine or tobacco, such as cigarettes and e-cigarettes. If you need help quitting, ask your health care provider.   Do not use alcohol or drugs.   Avoid stress as much as possible. Rest and get plenty of sleep.  General instructions   Take over-the-counter and prescription medicines only as  told by your health care provider.   When lying down, lie on your left side. This keeps pressure off your major blood vessels.   When sitting or lying down, raise (elevate) your feet. Try putting some pillows underneath your lower legs.   Exercise regularly. Ask your health care provider what kinds of exercise are best for you.   Keep all follow-up and prenatal visits as told by your health care provider. This is important.  How is this prevented?  There is no known way of preventing preeclampsia or eclampsia from developing. However, to lower your risk of complications and detect problems early:   Get regular prenatal care. Your health care provider may be able to diagnose and treat the condition early.   Maintain a healthy weight. Ask your health care provider for help managing weight gain during pregnancy.   Work with your health care provider to manage any long-term (chronic) health conditions you have, such as diabetes or kidney problems.   You may have tests of your blood pressure and kidney function after giving birth.   Your health care provider may have you take low-dose aspirin during your next pregnancy.  Contact a health care provider if:   You have symptoms that your health care provider told you may require more treatment or monitoring, such as:  ? Headaches.  ? Nausea or vomiting.  ? Abdominal pain.  ? Dizziness.  ? Light-headedness.  Get help right away if:   You have severe:  ? Abdominal pain.  ? Headaches that do not get better.  ? Dizziness.  ? Vision problems.  ? Confusion.  ? Nausea or vomiting.   You have any of the following:  ? A seizure.  ? Sudden, rapid weight gain.  ? Sudden swelling in your hands, ankles, or face.  ? Trouble moving any part of your body.  ? Numbness in any part of your body.  ? Trouble speaking.  ? Abnormal bleeding.   You faint.  Summary   Preeclampsia is a serious condition that may develop during pregnancy. It is also called toxemia of pregnancy.   This  condition causes high blood pressure along with other symptoms, such as swelling and headaches.   Diagnosing and treating preeclampsia early is very important. If not treated early, it can cause serious problems for you and your baby.   Get help right away if you have symptoms that your health care provider told you to watch for.  This information is not intended to replace advice given to you by your health care provider. Make sure you discuss any questions you have with your health care provider.  Document Released: 03/16/2000 Document Revised: 03/05/2017 Document Reviewed: 10/24/2015  Elsevier Interactive Patient Education  2019 [employer]

## 2018-03-20 NOTE — Progress Notes (Signed)
Pt is here for BP check. Pt was induced on 03/12/18 for hypertension and delivered vaginally on 03/13/18. Patients blood pressure is elevated today, patient has c/o headaches and blurry vision. Blood pressure and symptoms reviewed with Dr. Rosana Hoes who advises that patient go over to MAU for evaluation. Pt aware, verbalizes understanding and is going over to MAU.

## 2018-03-20 NOTE — MAU Note (Signed)
Patient states she was induced for HTN and delivered her baby vaginally on 03/13/18.  Reports going to her PP BP check and her pressure was 172/114.  Has had HA and visual changes on and off all week.  Today, states her HA is mild 3/10 and denies visual changes today.  Reports some light spotting, but that bleeding is minimal.

## 2018-03-24 ENCOUNTER — Ambulatory Visit: Payer: Medicaid Other

## 2018-03-24 ENCOUNTER — Ambulatory Visit (INDEPENDENT_AMBULATORY_CARE_PROVIDER_SITE_OTHER): Payer: Medicaid Other

## 2018-03-24 VITALS — BP 126/83 | HR 83 | Wt 170.0 lb

## 2018-03-24 DIAGNOSIS — O165 Unspecified maternal hypertension, complicating the puerperium: Secondary | ICD-10-CM

## 2018-03-24 NOTE — Progress Notes (Signed)
Subjective:  Tiffany Ruiz is a 27 y.o. female here for BP check.   Hypertension ROS: taking medications as instructed, no medication side effects noted, no TIA's, no chest pain on exertion, no dyspnea on exertion and no swelling of ankles.    Objective:  BP 126/83   Pulse 83   Wt 170 lb (77.1 kg)   LMP 06/23/2017   BMI 31.09 kg/m   Appearance alert, well appearing, and in no distress. General exam BP noted to be well controlled today in office.    Assessment:   Blood Pressure well controlled.   Plan:  Current treatment plan is effective, no change in therapy. Return in 3 weeks for Postpartum Check up per Dr. Jodi Mourning.

## 2018-04-21 ENCOUNTER — Ambulatory Visit: Payer: Self-pay | Admitting: Women's Health

## 2018-04-28 ENCOUNTER — Encounter: Payer: Self-pay | Admitting: Obstetrics and Gynecology

## 2018-04-28 ENCOUNTER — Ambulatory Visit (INDEPENDENT_AMBULATORY_CARE_PROVIDER_SITE_OTHER): Payer: Medicaid Other | Admitting: Obstetrics and Gynecology

## 2018-04-28 VITALS — BP 117/84 | HR 94 | Wt 166.0 lb

## 2018-04-28 DIAGNOSIS — Z1389 Encounter for screening for other disorder: Secondary | ICD-10-CM

## 2018-04-28 DIAGNOSIS — O133 Gestational [pregnancy-induced] hypertension without significant proteinuria, third trimester: Secondary | ICD-10-CM

## 2018-04-28 NOTE — Progress Notes (Signed)
Obstetrics/Postpartum Visit  Appointment Date: 04/28/2018  OBGYN Clinic: [facility]  Primary Care Provider: Patient, No Pcp Per  Chief Complaint:  Chief Complaint  Patient presents with  . Postpartum Care    History of Present Illness: Tiffany Ruiz is a 28 y.o. African-American G1P1001 (Patient's last menstrual period was 04/24/2018 (approximate).), seen for the above chief complaint. Her past medical history is significant for n/a.  She is s/p SVD on 03/13/18 at 37 weeks; she was discharged to home on PPD#2. Pregnancy complicated by gestational HTN for which she was induced at 37 weeks, sent home on meds.  Complains of nothing, had some vaginal pain at first but now feeling much better.   Vaginal bleeding or discharge: Yes , had a period Breast or formula feeding: breast pumping Intercourse: No  Contraception: condoms PP depression s/s: No  Any bowel or bladder issues: No  Pap smear: no abnormalities (date: 08/2017)  Review of Systems: Positive for n/a.   Her 12 point review of systems is negative or as noted in the History of Present Illness.  Patient Active Problem List   Diagnosis Date Noted  . Postpartum care following vaginal delivery 03/15/2018   Medications Vonda Suddeth had no medications administered during this visit. Current Outpatient Medications  Medication Sig Dispense Refill  . ferrous sulfate (FERROUSUL) 325 (65 FE) MG tablet Take 1 tablet (325 mg total) by mouth daily with breakfast. 30 tablet 5  . PRENATAL 27-1 MG TABS Take 1 tablet by mouth daily.  10  . ibuprofen (ADVIL,MOTRIN) 600 MG tablet Take 1 tablet (600 mg total) by mouth every 6 (six) hours. (Patient not taking: Reported on 04/28/2018) 30 tablet 0  . NIFEdipine (PROCARDIA XL) 30 MG 24 hr tablet Take 1 tablet (30 mg total) by mouth daily. 30 tablet 0   No current facility-administered medications for this visit.     Allergies Patient has no known allergies.  Physical Exam:  BP 117/84    Pulse 94   Wt 166 lb (75.3 kg)   LMP 04/24/2018 (Approximate)   Breastfeeding Yes   BMI 30.36 kg/m  Body mass index is 30.36 kg/m. General appearance: Well nourished, well developed female in no acute distress.  Cardiovascular: regular rate and rhythm Respiratory:  Clear to auscultation bilateral. Normal respiratory effort Abdomen: positive bowel sounds and no masses, hernias; diffusely non tender to palpation, non distended Breasts: not examined. Neuro/Psych:  Normal mood and affect.  Skin:  Warm and dry.    PP Depression Screening:   Edinburgh Postnatal Depression Scale - 04/28/18 0924      Edinburgh Postnatal Depression Scale:  In the Past 7 Days   I have been able to laugh and see the funny side of things.  0    I have looked forward with enjoyment to things.  0    I have blamed myself unnecessarily when things went wrong.  0    I have been anxious or worried for no good reason.  0    I have felt scared or panicky for no good reason.  0    Things have been getting on top of me.  0    I have been so unhappy that I have had difficulty sleeping.  0    I have felt sad or miserable.  0    I have been so unhappy that I have been crying.  0    The thought of harming myself has occurred to me.  0  Edinburgh Postnatal Depression Scale Total  0       Assessment: Patient is a 28 y.o. G1P1001 who is 6 weeks post partum from a SVD. She is doing well. Okay to return to gym for light workouts.  Plan:   1. Gestational hypertension, third trimester Finished meds last week BP stable today  2. Postpartum state Doing well, no issues   RTC 1 year for annual   K. Arvilla Meres, M.D. Attending Center for [employer] farm manager)

## 2018-05-02 ENCOUNTER — Other Ambulatory Visit (HOSPITAL_COMMUNITY): Payer: Self-pay | Admitting: Advanced Practice Midwife

## 2018-06-11 ENCOUNTER — Telehealth: Payer: Self-pay | Admitting: Obstetrics and Gynecology

## 2018-06-11 NOTE — Telephone Encounter (Signed)
Tiffany Ruiz with [facility] called to verify the patient [employer] Requested the following information: Does she attend our office? What is the last date the patient visited our office? and What doctor did she see? I informed the representative she attend the [facility] and supplied him with the number to the clinic. He then asked what doctor she sees? I informed the representative to call the clinic to speak with a registrar and give them the information he gave me about making a referral. No information was given about the patient.

## 2018-08-14 ENCOUNTER — Other Ambulatory Visit: Payer: Self-pay

## 2018-08-14 ENCOUNTER — Telehealth: Payer: Self-pay | Admitting: *Deleted

## 2018-08-14 ENCOUNTER — Ambulatory Visit (HOSPITAL_COMMUNITY)
Admission: EM | Admit: 2018-08-14 | Discharge: 2018-08-14 | Disposition: A | Discharge status: Home / Self Care | Payer: Medicaid Other | Attending: Family Medicine | Admitting: Family Medicine

## 2018-08-14 ENCOUNTER — Encounter (HOSPITAL_COMMUNITY): Payer: Self-pay | Admitting: Family Medicine

## 2018-08-14 DIAGNOSIS — B029 Zoster without complications: Secondary | ICD-10-CM | POA: Diagnosis not present

## 2018-08-14 MED ORDER — PREDNISONE 10 MG (21) PO TBPK: ORAL_TABLET | Freq: Every day | ORAL | 0 refills | Status: AC

## 2018-08-14 MED ORDER — VALACYCLOVIR HCL 1 G PO TABS: 1000.0000 mg | ORAL_TABLET | Freq: Three times a day (TID) | ORAL | 0 refills | Status: AC

## 2018-08-14 NOTE — Discharge Instructions (Signed)
You may use over the counter ibuprofen or acetaminophen as needed.  ° °

## 2018-08-14 NOTE — Telephone Encounter (Signed)
Pt called to office about a spot on her back that is spreading.  Return call to pt. Pt states she noticed small area that was itching and has now spread into a bigger area. Pt states it is small raised rash like with itching. Pt advised to try Benadryl, Calamine cream and may also take Benadryl PO to help with itching.  Pt advised if she gets no relief to be seen at Urgent Care.  Pt states understanding.

## 2018-08-14 NOTE — ED Triage Notes (Signed)
Per pt she has been having a bad burning rash on her back and right breast. Tender and red with red bumps and burning. This started on Sunday. Taking benadryl nothing really helping.

## 2018-08-19 NOTE — ED Provider Notes (Signed)
[facility]   [id number] 08/14/18 Arrival Time: 7858  ASSESSMENT & PLAN:  1. Herpes zoster without complication    Meds ordered this encounter  Medications  . valACYclovir (VALTREX) 1000 MG tablet    Sig: Take 1 tablet (1,000 mg total) by mouth 3 (three) times daily.    Dispense:  21 tablet    Refill:  0  . predniSONE (STERAPRED UNI-PAK 21 TAB) 10 MG (21) TBPK tablet    Sig: Take by mouth daily. Take as directed.    Dispense:  21 tablet    Refill:  0   OTC analgesics as needed. Will follow up with PCP or here if worsening or failing to improve as anticipated.  Reviewed expectations re: course of current medical issues. Questions answered. Outlined signs and symptoms indicating need for more acute intervention. Patient verbalized understanding. After Visit Summary given.   SUBJECTIVE:  Tiffany Ruiz is a 28 y.o. female who presents with a skin complaint.   Location: inner R breast and on R back Onset: gradual Duration: 3-4 days Associated pruritis? none Associated pain? Minimal discomfort Progression: stable  Drainage? No  Known trigger? No  New soaps/lotions/topicals/detergents/environmental exposures? No Contacts with similar? No Recent travel? No  Other associated symptoms: none Therapies tried thus far: none Arthralgia or myalgia? none Recent illness? none Fever? none No specific aggravating or alleviating factors reported.  ROS: As per HPI. All other systems negative.   OBJECTIVE: Vitals:   08/14/18 1735  BP: 123/84  Pulse: 98  Resp: 16  Temp: 98.6 F (37 C)  TempSrc: Oral  SpO2: 99%    General appearance: alert; no distress Lungs: clear to auscultation bilaterally Heart: regular rate and rhythm Extremities: no edema Skin: warm and dry; signs of infection: no; a few crops of red/purplish papules over inner R breast and on R back consistent with zoster; some crusting; no bleeding Psychological: alert and cooperative; normal mood  and affect  No Known Allergies  Past Medical History:  Diagnosis Date  . Gestational hypertension 03/12/2018  . Hypertension   . Nose fracture   . Supervision of normal first pregnancy, antepartum 09/19/2017    Nursing Staff Provider Office Location  [facility] Dating   LMP Language   English Anatomy US  WNL-incomplete, [place] seen-f/u in 4 wks sched Flu Vaccine   declined 9/9 Genetic Screen  NIPS:low risks   AFP:   First Screen:  Quad:   TDaP vaccine   02/24/18 Hgb A1C or  GTT Early 5.4 Third trimester  Rhogam  N/a O+   LAB RESULTS  Feeding Plan  breast Blood Type O/Positive/-- (06/20 1650)  Contraception dec  . Unilateral choroid plexus cyst of fetus on prenatal ultrasound 11/11/2017   No clinical significance, had negative NIPS   Social History   Socioeconomic History  . Marital status: Single    Spouse name: Not on file  . Number of children: Not on file  . Years of education: Not on file  . Highest education level: Not on file  Occupational History  . Not on file  Social Needs  . Financial resource strain: Not hard at all  . Food insecurity:    Worry: Never true    Inability: Never true  . Transportation needs:    Medical: No    Non-medical: No  Tobacco Use  . Smoking status: Never Smoker  . Smokeless tobacco: Never Used  Substance and Sexual Activity  . Alcohol use: Not Currently  . Drug use: Never  .  Sexual activity: Not Currently  Lifestyle  . Physical activity:    Days per week: Not on file    Minutes per session: Not on file  . Stress: Not at all  Relationships  . Social connections:    Talks on phone: Not on file    Gets together: Not on file    Attends religious service: Not on file    Active member of club or organization: Not on file    Attends meetings of clubs or organizations: Not on file    Relationship status: Not on file  . Intimate partner violence:    Fear of current or ex partner: Not on file    Emotionally abused: Not on file    Physically abused:  Not on file    Forced sexual activity: Not on file  Other Topics Concern  . Not on file  Social History Narrative  . Not on file   Family History  Problem Relation Age of Onset  . Stroke Maternal Grandfather    Past Surgical History:  Procedure Laterality Date  . MOUTH SURGERY  2011   wisdom tooth   . NOSE SURGERY       Vanessa Kick, MD 09/02/18 [phone]

## 2019-01-12 ENCOUNTER — Other Ambulatory Visit: Payer: Self-pay

## 2019-01-12 MED ORDER — PRENATAL VITAMIN PLUS LOW IRON 27-1 MG PO TABS: 1.0000 | ORAL_TABLET | Freq: Every day | ORAL | 10 refills | Status: AC

## 2019-12-22 ENCOUNTER — Ambulatory Visit (HOSPITAL_COMMUNITY)
Admission: EM | Admit: 2019-12-22 | Discharge: 2019-12-22 | Disposition: A | Discharge status: Home / Self Care | Payer: Medicaid Other | Attending: Physician Assistant | Admitting: Physician Assistant

## 2019-12-22 ENCOUNTER — Ambulatory Visit (INDEPENDENT_AMBULATORY_CARE_PROVIDER_SITE_OTHER): Payer: Medicaid Other

## 2019-12-22 ENCOUNTER — Other Ambulatory Visit: Payer: Self-pay

## 2019-12-22 ENCOUNTER — Encounter (HOSPITAL_COMMUNITY): Payer: Self-pay

## 2019-12-22 DIAGNOSIS — S161XXA Strain of muscle, fascia and tendon at neck level, initial encounter: Secondary | ICD-10-CM | POA: Diagnosis not present

## 2019-12-22 DIAGNOSIS — S39012A Strain of muscle, fascia and tendon of lower back, initial encounter: Secondary | ICD-10-CM

## 2019-12-22 DIAGNOSIS — M542 Cervicalgia: Secondary | ICD-10-CM | POA: Diagnosis not present

## 2019-12-22 MED ORDER — TIZANIDINE HCL 4 MG PO TABS: 4.0000 mg | ORAL_TABLET | Freq: Every day | ORAL | 0 refills | Status: AC

## 2019-12-22 MED ORDER — NAPROXEN 375 MG PO TABS: 375.0000 mg | ORAL_TABLET | Freq: Two times a day (BID) | ORAL | 0 refills | Status: AC
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
